# Patient Record
Sex: Male | Born: 1935 | Race: Asian | Hispanic: No | Marital: Married | Smoking: Current every day smoker
Health system: Southern US, Community
[De-identification: ages and names within clinical notes are randomized; demographics above are authoritative.]

## PROBLEM LIST (undated history)

## (undated) DIAGNOSIS — C61 Malignant neoplasm of prostate: Secondary | ICD-10-CM

## (undated) DIAGNOSIS — J45909 Unspecified asthma, uncomplicated: Secondary | ICD-10-CM

## (undated) DIAGNOSIS — M1990 Unspecified osteoarthritis, unspecified site: Secondary | ICD-10-CM

## (undated) DIAGNOSIS — K219 Gastro-esophageal reflux disease without esophagitis: Secondary | ICD-10-CM

## (undated) HISTORY — DX: Malignant neoplasm of prostate: C61

## (undated) HISTORY — DX: Unspecified osteoarthritis, unspecified site: M19.90

## (undated) HISTORY — PX: PROSTATE BIOPSY: SHX241

## (undated) HISTORY — DX: Gastro-esophageal reflux disease without esophagitis: K21.9

## (undated) HISTORY — DX: Unspecified asthma, uncomplicated: J45.909

---

## 2008-03-29 ENCOUNTER — Emergency Department (HOSPITAL_COMMUNITY): Admission: EM | Admit: 2008-03-29 | Discharge: 2008-03-29 | Payer: Self-pay | Admitting: Emergency Medicine

## 2008-07-13 ENCOUNTER — Encounter: Admission: RE | Admit: 2008-07-13 | Discharge: 2008-07-13 | Payer: Self-pay | Admitting: Pulmonary Disease

## 2011-04-15 ENCOUNTER — Encounter: Payer: Self-pay | Admitting: Gastroenterology

## 2013-06-27 ENCOUNTER — Other Ambulatory Visit (HOSPITAL_COMMUNITY): Payer: Self-pay | Admitting: Urology

## 2013-06-27 DIAGNOSIS — C61 Malignant neoplasm of prostate: Secondary | ICD-10-CM

## 2013-07-07 ENCOUNTER — Ambulatory Visit (HOSPITAL_COMMUNITY)
Admission: RE | Admit: 2013-07-07 | Discharge: 2013-07-07 | Disposition: A | Discharge status: Home / Self Care | Payer: Medicaid Other | Source: Ambulatory Visit | Attending: Urology | Admitting: Urology

## 2013-07-07 ENCOUNTER — Encounter (HOSPITAL_COMMUNITY): Payer: Medicaid Other

## 2013-07-07 DIAGNOSIS — C61 Malignant neoplasm of prostate: Secondary | ICD-10-CM | POA: Insufficient documentation

## 2013-07-07 MED ORDER — TECHNETIUM TC 99M MEDRONATE IV KIT
25.0000 | PACK | Freq: Once | INTRAVENOUS | Status: AC | PRN
  Administered 2013-07-07: 25 via INTRAVENOUS

## 2013-08-02 ENCOUNTER — Encounter: Payer: Self-pay | Admitting: Radiation Oncology

## 2013-08-02 ENCOUNTER — Ambulatory Visit
Admission: RE | Admit: 2013-08-02 | Discharge: 2013-08-02 | Disposition: A | Discharge status: Home / Self Care | Payer: Medicaid Other | Source: Ambulatory Visit | Attending: Radiation Oncology | Admitting: Radiation Oncology

## 2013-08-02 VITALS — Ht 60.0 in | Wt 118.2 lb

## 2013-08-02 DIAGNOSIS — F17200 Nicotine dependence, unspecified, uncomplicated: Secondary | ICD-10-CM | POA: Insufficient documentation

## 2013-08-02 DIAGNOSIS — K219 Gastro-esophageal reflux disease without esophagitis: Secondary | ICD-10-CM | POA: Diagnosis not present

## 2013-08-02 DIAGNOSIS — C61 Malignant neoplasm of prostate: Secondary | ICD-10-CM | POA: Diagnosis not present

## 2013-08-02 DIAGNOSIS — M1990 Unspecified osteoarthritis, unspecified site: Secondary | ICD-10-CM | POA: Insufficient documentation

## 2013-08-02 DIAGNOSIS — Z79899 Other long term (current) drug therapy: Secondary | ICD-10-CM | POA: Insufficient documentation

## 2013-08-02 DIAGNOSIS — J45909 Unspecified asthma, uncomplicated: Secondary | ICD-10-CM | POA: Diagnosis not present
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
## 2013-08-02 NOTE — Progress Notes (Signed)
GU Location of Tumor / Histology: prostatic adenocarcinoma (intermediate risk)  If Prostate Cancer, Gleason Score is (3 + 4) and PSA is (19.25)  Patient referred by Dr. Vista Lawman to Dr. Janice Norrie on 03/24/2012 for an elevated PSA of 15.5. PSA in March 2010 5.9.  Biopsies of prostate (if applicable) revealed:    Past/Anticipated interventions by urology, if any: Not a candidate for radical prostatectomy or seed implantation. Not ideal candidate for active surveillance. Nesi encouraging IMRT or cryoablation.   Past/Anticipated interventions by medical oncology, if any: None  Weight changes, if any: None noted  Bowel/Bladder complaints, if any: No dysuria. No hematuria. Nocturia x3.  Urgency.   Nausea/Vomiting, if any: None noted  Pain issues, if any:  None noted  SAFETY ISSUES:  Prior radiation? NO  Pacemaker/ICD? NO  Possible current pregnancy? NO  Is the patient on methotrexate? NO  Current Complaints / other details:  80 year old male. Married. Prostate volume 52.55 cc. Gabriel Newman had a negative prostate biopsy in Feb 2014 for PSA of 15.5. Current everyday smoker. NKDA. Niece often accompanies patient to interpret.

## 2013-08-02 NOTE — Progress Notes (Signed)
See progress note under physician encounter. 

## 2013-08-02 NOTE — Progress Notes (Signed)
Radiation Oncology         [phone] ________________________________  Initial outpatient Consultation  Name: Gabriel Newman  MRN: [id number]  Date: 08/02/2013  DOB: [birth year]  CC:No primary provider on file.  Arvil Persons, MD   REFERRING PHYSICIAN: Arvil Persons, MD  DIAGNOSIS: 80 y.o. gentleman with stage T1c adenocarcinoma of the prostate with a Gleason's score of 3+4 and a PSA of 19.25  HISTORY OF PRESENT ILLNESS::Gabriel Newman is a 80 y.o. gentleman.  He was noted to have an elevated PSA by his primary care physician, Dr. Vista Lawman of 15.5.  Accordingly, he was referred for evaluation in urology by Dr. Janice Norrie on 03/24/12.  Biopsy at that time was negative.  His PSA increased to 19.25 on 05/18/13,  digital rectal examination was performed on 05/25/13 by Dr. Janice Norrie revealed no nodules.  The patient proceeded to transrectal ultrasound with 12 biopsies of the prostate on 06/16/13.  The prostate volume measured 52.22 cc.  Out of 12 core biopsies, 3 were positive.  The maximum Gleason score was 3+4, and this was seen in the distribution below:   The patient reviewed the biopsy results with his urologist and he has kindly been referred today for discussion of potential radiation treatment options.  PREVIOUS RADIATION THERAPY: No  PAST MEDICAL HISTORY:  has a past medical history of Prostate cancer; Arthritis; Asthma; and GERD (gastroesophageal reflux disease).    PAST SURGICAL HISTORY: Past Surgical History  Procedure Laterality Date  . Prostate biopsy    . Prostate biopsy      FAMILY HISTORY: family history is negative for Cancer.  SOCIAL HISTORY:  reports that he has been smoking.  He has never used smokeless tobacco. He reports that he does not drink alcohol or use illicit drugs.  ALLERGIES: Review of patient's allergies indicates no known allergies.  MEDICATIONS:  Current Outpatient Prescriptions  Medication Sig Dispense Refill  . albuterol (PROVENTIL HFA;VENTOLIN HFA) 108 (90 BASE) MCG/ACT  inhaler Inhale into the lungs every 6 (six) hours as needed for wheezing or shortness of breath.      . ferrous sulfate 325 (65 FE) MG tablet Take 325 mg by mouth daily with breakfast.      . Fluticasone-Salmeterol (ADVAIR) 250-50 MCG/DOSE AEPB Inhale 1 puff into the lungs 2 (two) times daily.      Marland Kitchen glucosamine-chondroitin 500-400 MG tablet Take 1 tablet by mouth 3 (three) times daily.      . pravastatin (PRAVACHOL) 20 MG tablet Take 20 mg by mouth daily.       No current facility-administered medications for this encounter.    REVIEW OF SYSTEMS:  A 15 point review of systems is documented in the electronic medical record. This was obtained by the nursing staff. However, I reviewed this with the patient to discuss relevant findings and make appropriate changes.  A comprehensive review of systems was negative..  The patient completed an IPSS and IIEF questionnaire.  His IPSS score was 16 indicating moderate urinary outflow obstructive symptoms.  He indicated that his erectile function is not to complete sexual activity but this is not concerning to the patient.   PHYSICAL EXAM: This patient is in no acute distress.  He is alert and oriented.   height is 5' (1.524 m) and weight is 118 lb 3.2 oz (53.615 kg).  He exhibits no respiratory distress or labored breathing.  He appears neurologically intact.  His mood is pleasant.  His affect is appropriate.  Please note the digital rectal  exam findings described above.  KPS = 100  100 - Normal; no complaints; no evidence of disease. 90   - Able to carry on normal activity; minor signs or symptoms of disease. 80   - Normal activity with effort; some signs or symptoms of disease. 85   - Cares for self; unable to carry on normal activity or to do active work. 60   - Requires occasional assistance, but is able to care for most of his personal needs. 50   - Requires considerable assistance and frequent medical care. 32   - Disabled; requires special care and  assistance. 46   - Severely disabled; hospital admission is indicated although death not imminent. 61   - Very sick; hospital admission necessary; active supportive treatment necessary. 10   - Moribund; fatal processes progressing rapidly. 0     - Dead  Karnofsky DA, Abelmann WH, Craver LS and Burchenal JH [phone]) The use of the nitrogen mustards in the palliative treatment of carcinoma: with particular reference to bronchogenic carcinoma Cancer 1 634-56   LABORATORY DATA:  No results found for this basename: WBC, HGB, HCT, MCV, PLT   No results found for this basename: NA, K, CL, CO2   No results found for this basename: ALT, AST, GGT, ALKPHOS, BILITOT     RADIOGRAPHY: Nm Bone Scan Whole Body  07/07/2013   CLINICAL DATA:  Prostate cancer.  EXAM: NUCLEAR MEDICINE WHOLE BODY BONE SCAN  TECHNIQUE: Whole body anterior and posterior images were obtained approximately 3 hours after intravenous injection of radiopharmaceutical.  RADIOPHARMACEUTICALS:  25.0 mCi Technetium-99 MDP  COMPARISON:  None.  FINDINGS: There are mild areas of degenerative type uptake involving the shoulders, sternoclavicular joints, hips and knees. I do not see any definite findings for osseous metastatic disease.  IMPRESSION: Negative whole body bone scan for osseous metastatic disease.   Electronically Signed   By: Kalman Jewels M.D.   On: 07/07/2013 15:07      IMPRESSION: This gentleman is a 80 y.o. gentleman with stage T1c adenocarcinoma of the prostate with a Gleason's score of 3+4 and a PSA of 19.25.  His T-Stage, Gleason's Score, and PSA put him into the intermediate risk group.  Accordingly he is eligible for a variety of potential treatment options including external radiotherapy. This could be combined with androgen deprivation to improve the likelihood of disease control.  PLAN:Today I reviewed the findings and workup thus far.  We discussed the natural history of prostate cancer.  We reviewed the the implications  of T-stage, Gleason's Score, and PSA on decision-making and outcomes in prostate cancer.  We discussed radiation treatment in the management of prostate cancer with regard to the logistics and delivery of external beam radiation treatment as well as the logistics and delivery of prostate brachytherapy.  We compared and contrasted each of these approaches and also compared these against prostatectomy.  The patient expressed interest in external beam radiotherapy.  I filled out a patient counseling form for him with relevant treatment diagrams and we retained a copy for our records.   The patient would like to proceed with androgen deprivation and prostate IMRT.  I will share my findings with Dr. Janice Norrie and move forward with scheduling initiation of hormone therapy followed by placement of three gold fiducial markers into the prostate to proceed with IMRT 2 months after initiation of hormone therapy.     I enjoyed meeting with him today, and will look forward to participating in the care of  this very nice gentleman.   I spent 60 minutes face to face with the patient and more than 50% of that time was spent in counseling and/or coordination of care.   ------------------------------------------------  Sheral Apley. Tammi Klippel, M.D.

## 2013-08-03 ENCOUNTER — Telehealth: Payer: Self-pay | Admitting: *Deleted

## 2013-08-03 NOTE — Telephone Encounter (Signed)
Called patient to inform of appts., lvm for a return call 

## 2013-10-05 ENCOUNTER — Other Ambulatory Visit: Payer: Self-pay | Admitting: Radiation Oncology

## 2013-10-05 ENCOUNTER — Telehealth: Payer: Self-pay | Admitting: *Deleted

## 2013-10-05 NOTE — Telephone Encounter (Signed)
Called patient to inform that sim appt. Has been moved from 10-06-13 @ 10 am  To 10-13-13 @ 9am, spoke with patient and he seemed to understand, phoned the interperator line and spoke with them and they will call the patient and make sure that he does understand this.

## 2013-10-06 ENCOUNTER — Ambulatory Visit: Payer: Medicaid Other | Admitting: Radiation Oncology

## 2013-10-12 ENCOUNTER — Telehealth: Payer: Self-pay | Admitting: *Deleted

## 2013-10-12 NOTE — Telephone Encounter (Signed)
CALLED PATIENT TO REMIND OF SIM APPT. FOR 10-13-13 @ 9 AM , LVM FOR A RETURN CALL.

## 2013-10-13 ENCOUNTER — Ambulatory Visit
Admission: RE | Admit: 2013-10-13 | Discharge: 2013-10-13 | Disposition: A | Discharge status: Home / Self Care | Payer: Medicaid Other | Source: Ambulatory Visit | Attending: Radiation Oncology | Admitting: Radiation Oncology

## 2013-10-13 DIAGNOSIS — C61 Malignant neoplasm of prostate: Secondary | ICD-10-CM

## 2013-10-13 DIAGNOSIS — Z510 Encounter for antineoplastic radiation therapy: Secondary | ICD-10-CM | POA: Diagnosis not present

## 2013-10-13 NOTE — Progress Notes (Signed)
  Radiation Oncology         [phone] ________________________________  Name: Marchello Rothgeb MRN: [id number]  Date: 10/13/2013  DOB: 1932/09/25  SIMULATION AND TREATMENT PLANNING NOTE  DIAGNOSIS:  Stage T1c adenocarcinoma of the prostate with a Gleason's Score of 3+4 and a PSA of 19.25 - Intermediate Risk  NARRATIVE:  The patient was brought to the [place] suite.  Identity was confirmed.  All relevant records and images related to the planned course of therapy were reviewed.  The patient freely provided informed written consent to proceed with treatment after reviewing the details related to the planned course of therapy. The consent form was witnessed and verified by the simulation staff.  Then, the patient was set-up in a stable reproducible supine position for radiation therapy.  A vacuum lock pillow device was custom fabricated to position his legs in a reproducible immobilized position.  Then, I performed a urethrogram under sterile conditions to identify the prostatic apex.  CT images were obtained.  Surface markings were placed.  The CT images were loaded into the planning software.  Then the prostate target and avoidance structures including the rectum, bladder, bowel and hips were contoured.  Treatment planning then occurred.  The radiation prescription was entered and confirmed.  A total of 1 complex treatment device was fabricated. I have requested : Intensity Modulated Radiotherapy (IMRT) is medically necessary for this case for the following reason:  Rectal sparing.  PLAN:  The patient will receive 78 Gy in 40 fractions.  ________________________________  Sheral Apley Tammi Klippel, M.D.

## 2013-10-16 ENCOUNTER — Telehealth: Payer: Self-pay | Admitting: Radiation Oncology

## 2013-10-16 NOTE — Telephone Encounter (Signed)
Place orange folder with [facility] paperwork in Dr. Johny Shears inbox to sign. Patient doesn't begin treatment until 10/24/2013.

## 2013-10-22 DIAGNOSIS — Z510 Encounter for antineoplastic radiation therapy: Secondary | ICD-10-CM | POA: Diagnosis not present

## 2013-10-24 ENCOUNTER — Ambulatory Visit
Admission: RE | Admit: 2013-10-24 | Discharge: 2013-10-24 | Disposition: A | Discharge status: Home / Self Care | Payer: Medicaid Other | Source: Ambulatory Visit | Attending: Radiation Oncology | Admitting: Radiation Oncology

## 2013-10-24 DIAGNOSIS — Z510 Encounter for antineoplastic radiation therapy: Secondary | ICD-10-CM | POA: Diagnosis not present

## 2013-10-25 ENCOUNTER — Ambulatory Visit
Admission: RE | Admit: 2013-10-25 | Discharge: 2013-10-25 | Disposition: A | Discharge status: Home / Self Care | Payer: Medicaid Other | Source: Ambulatory Visit | Attending: Radiation Oncology | Admitting: Radiation Oncology

## 2013-10-25 DIAGNOSIS — Z510 Encounter for antineoplastic radiation therapy: Secondary | ICD-10-CM | POA: Diagnosis not present

## 2013-10-26 ENCOUNTER — Ambulatory Visit
Admission: RE | Admit: 2013-10-26 | Discharge: 2013-10-26 | Disposition: A | Discharge status: Home / Self Care | Payer: Medicaid Other | Source: Ambulatory Visit | Attending: Radiation Oncology | Admitting: Radiation Oncology

## 2013-10-26 DIAGNOSIS — Z510 Encounter for antineoplastic radiation therapy: Secondary | ICD-10-CM | POA: Diagnosis not present

## 2013-10-27 ENCOUNTER — Ambulatory Visit
Admission: RE | Admit: 2013-10-27 | Discharge: 2013-10-27 | Disposition: A | Discharge status: Home / Self Care | Payer: Medicaid Other | Source: Ambulatory Visit | Attending: Radiation Oncology | Admitting: Radiation Oncology

## 2013-10-27 ENCOUNTER — Encounter: Payer: Self-pay | Admitting: Radiation Oncology

## 2013-10-27 VITALS — BP 126/70 | HR 75 | Resp 16 | Wt 114.8 lb

## 2013-10-27 DIAGNOSIS — C61 Malignant neoplasm of prostate: Secondary | ICD-10-CM

## 2013-10-27 DIAGNOSIS — Z510 Encounter for antineoplastic radiation therapy: Secondary | ICD-10-CM | POA: Diagnosis not present

## 2013-10-27 NOTE — Progress Notes (Addendum)
Denies dysuria or hematuria. Weight and vitals stable. Denies pain. Reports nocturia x4. Remain active and denies fatigue. Denies urgency, incontinence or difficulty emptying his bladder. Accompanied by translator, Elberta Fortis today. Post sim education completed today. Oriented patient to staff and routine of the clinic. Provided patient with RADIATION THERAPY AND YOU handbook then, reviewed pertinent information. Educated patient reference potential side effects and management such as, fatigue, skin changes, and urinary/bladder changes. Allow patient the opportunity to ask questions. Patient verbalized understanding of all reviewed.

## 2013-10-27 NOTE — Progress Notes (Signed)
  Radiation Oncology         [phone] ________________________________  Name: Gabriel Newman MRN: [id number]  Date: 10/27/2013  DOB: 1932/07/27  Weekly Radiation Therapy Management  Current Dose: 7.8 Gy     Planned Dose:  78 Gy  Narrative . . . . . . . . The patient presents for routine under treatment assessment.                                   The patient is without complaint.                                 Set-up films were reviewed.                                 The chart was checked. Physical Findings. . .  weight is 114 lb 12.8 oz (52.073 kg). His blood pressure is 126/70 and his pulse is 75. His respiration is 16. . Weight essentially stable.  No significant changes. Impression . . . . . . . The patient is tolerating radiation. Plan . . . . . . . . . . . . Continue treatment as planned.  ________________________________  Sheral Apley. Tammi Klippel, M.D.

## 2013-10-30 ENCOUNTER — Ambulatory Visit
Admission: RE | Admit: 2013-10-30 | Discharge: 2013-10-30 | Disposition: A | Discharge status: Home / Self Care | Payer: Medicaid Other | Source: Ambulatory Visit | Attending: Radiation Oncology | Admitting: Radiation Oncology

## 2013-10-30 DIAGNOSIS — Z510 Encounter for antineoplastic radiation therapy: Secondary | ICD-10-CM | POA: Diagnosis not present

## 2013-10-31 ENCOUNTER — Ambulatory Visit
Admission: RE | Admit: 2013-10-31 | Discharge: 2013-10-31 | Disposition: A | Discharge status: Home / Self Care | Payer: Medicaid Other | Source: Ambulatory Visit | Attending: Radiation Oncology | Admitting: Radiation Oncology

## 2013-10-31 DIAGNOSIS — Z510 Encounter for antineoplastic radiation therapy: Secondary | ICD-10-CM | POA: Diagnosis not present

## 2013-11-01 ENCOUNTER — Ambulatory Visit
Admission: RE | Admit: 2013-11-01 | Discharge: 2013-11-01 | Disposition: A | Discharge status: Home / Self Care | Payer: Medicaid Other | Source: Ambulatory Visit | Attending: Radiation Oncology | Admitting: Radiation Oncology

## 2013-11-01 DIAGNOSIS — Z510 Encounter for antineoplastic radiation therapy: Secondary | ICD-10-CM | POA: Diagnosis not present

## 2013-11-02 ENCOUNTER — Encounter: Payer: Self-pay | Admitting: *Deleted

## 2013-11-02 ENCOUNTER — Ambulatory Visit
Admission: RE | Admit: 2013-11-02 | Discharge: 2013-11-02 | Disposition: A | Discharge status: Home / Self Care | Payer: Medicaid Other | Source: Ambulatory Visit | Attending: Radiation Oncology | Admitting: Radiation Oncology

## 2013-11-02 DIAGNOSIS — Z510 Encounter for antineoplastic radiation therapy: Secondary | ICD-10-CM | POA: Diagnosis not present

## 2013-11-02 NOTE — Progress Notes (Signed)
[place] Work  Clinical Social Work completed Product/process development scientist on behalf of pt. [facility] has approved pt for transportation assistance and can begin providing services immediately. [facility] reports they contacted pt's contact that speaks English and relayed this information as well.  Clinical Social Worker available to assist further as needed.    Clinical Social Work interventions: Resource assistance  [employer] Clinical Social Worker [place] [place] [place] for [place] Wednesday, Thursday and Friday Phone: [phone] Fax: [phone]

## 2013-11-03 ENCOUNTER — Ambulatory Visit
Admission: RE | Admit: 2013-11-03 | Discharge: 2013-11-03 | Disposition: A | Discharge status: Home / Self Care | Payer: Medicaid Other | Source: Ambulatory Visit | Attending: Radiation Oncology | Admitting: Radiation Oncology

## 2013-11-03 ENCOUNTER — Encounter: Payer: Self-pay | Admitting: Radiation Oncology

## 2013-11-03 VITALS — BP 151/72 | HR 72 | Resp 16 | Wt 115.9 lb

## 2013-11-03 DIAGNOSIS — C61 Malignant neoplasm of prostate: Secondary | ICD-10-CM

## 2013-11-03 DIAGNOSIS — Z510 Encounter for antineoplastic radiation therapy: Secondary | ICD-10-CM | POA: Diagnosis not present

## 2013-11-03 NOTE — Progress Notes (Signed)
  Radiation Oncology         [phone] ________________________________  Name: Gabriel Newman MRN: [id number]  Date: 11/03/2013 DOB: 1933-01-08  Weekly Radiation Therapy Management  Current Dose: 17.55 Gy     Planned Dose:  78 Gy  Narrative . . . . . . . . The patient presents for routine under treatment assessment.                                   The patient is without complaint.                                 Set-up films were reviewed.                                 The chart was checked. Physical Findings. . .  weight is 115 lb 14.4 oz (52.572 kg). His blood pressure is 151/72 and his pulse is 72. His respiration is 16. . Weight essentially stable.  No significant changes. Impression . . . . . . . The patient is tolerating radiation. Plan . . . . . . . . . . . . Continue treatment as planned.  ________________________________  Sheral Apley. Tammi Klippel, M.D.

## 2013-11-03 NOTE — Progress Notes (Signed)
Denies pain today. Weight and vitals stable. Denies hematuria, dysuria or diarrhea. Reports nocturia x4. Denies fatigue.

## 2013-11-06 ENCOUNTER — Ambulatory Visit
Admission: RE | Admit: 2013-11-06 | Discharge: 2013-11-06 | Disposition: A | Discharge status: Home / Self Care | Payer: Medicaid Other | Source: Ambulatory Visit | Attending: Radiation Oncology | Admitting: Radiation Oncology

## 2013-11-06 DIAGNOSIS — Z510 Encounter for antineoplastic radiation therapy: Secondary | ICD-10-CM | POA: Diagnosis not present

## 2013-11-07 ENCOUNTER — Ambulatory Visit
Admission: RE | Admit: 2013-11-07 | Discharge: 2013-11-07 | Disposition: A | Discharge status: Home / Self Care | Payer: Medicaid Other | Source: Ambulatory Visit | Attending: Radiation Oncology | Admitting: Radiation Oncology

## 2013-11-07 DIAGNOSIS — Z510 Encounter for antineoplastic radiation therapy: Secondary | ICD-10-CM | POA: Diagnosis not present

## 2013-11-07 NOTE — Addendum Note (Signed)
Encounter addended by: Heywood Footman, RN on: 11/07/2013 11:43 AM<BR>     Documentation filed: Notes Section, Inpatient Document Flowsheet

## 2013-11-08 ENCOUNTER — Ambulatory Visit
Admission: RE | Admit: 2013-11-08 | Discharge: 2013-11-08 | Disposition: A | Discharge status: Home / Self Care | Payer: Medicaid Other | Source: Ambulatory Visit | Attending: Radiation Oncology | Admitting: Radiation Oncology

## 2013-11-08 DIAGNOSIS — Z510 Encounter for antineoplastic radiation therapy: Secondary | ICD-10-CM | POA: Diagnosis not present

## 2013-11-09 ENCOUNTER — Ambulatory Visit
Admission: RE | Admit: 2013-11-09 | Discharge: 2013-11-09 | Disposition: A | Discharge status: Home / Self Care | Payer: Medicaid Other | Source: Ambulatory Visit | Attending: Radiation Oncology | Admitting: Radiation Oncology

## 2013-11-09 DIAGNOSIS — Z510 Encounter for antineoplastic radiation therapy: Secondary | ICD-10-CM | POA: Diagnosis not present

## 2013-11-10 ENCOUNTER — Ambulatory Visit
Admission: RE | Admit: 2013-11-10 | Discharge: 2013-11-10 | Disposition: A | Discharge status: Home / Self Care | Payer: Medicaid Other | Source: Ambulatory Visit | Attending: Radiation Oncology | Admitting: Radiation Oncology

## 2013-11-10 ENCOUNTER — Encounter: Payer: Self-pay | Admitting: Radiation Oncology

## 2013-11-10 VITALS — BP 117/68 | HR 70 | Resp 16 | Wt 116.1 lb

## 2013-11-10 DIAGNOSIS — Z510 Encounter for antineoplastic radiation therapy: Secondary | ICD-10-CM | POA: Diagnosis not present

## 2013-11-10 DIAGNOSIS — C61 Malignant neoplasm of prostate: Secondary | ICD-10-CM
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
## 2013-11-14 ENCOUNTER — Ambulatory Visit
Admission: RE | Admit: 2013-11-14 | Discharge: 2013-11-14 | Disposition: A | Discharge status: Home / Self Care | Payer: Medicaid Other | Source: Ambulatory Visit | Attending: Radiation Oncology | Admitting: Radiation Oncology

## 2013-11-14 DIAGNOSIS — Z510 Encounter for antineoplastic radiation therapy: Secondary | ICD-10-CM | POA: Diagnosis not present

## 2013-11-15 ENCOUNTER — Ambulatory Visit
Admission: RE | Admit: 2013-11-15 | Discharge: 2013-11-15 | Disposition: A | Discharge status: Home / Self Care | Payer: Medicaid Other | Source: Ambulatory Visit | Attending: Radiation Oncology | Admitting: Radiation Oncology

## 2013-11-15 DIAGNOSIS — Z510 Encounter for antineoplastic radiation therapy: Secondary | ICD-10-CM | POA: Diagnosis not present

## 2013-11-16 ENCOUNTER — Ambulatory Visit
Admission: RE | Admit: 2013-11-16 | Discharge: 2013-11-16 | Disposition: A | Discharge status: Home / Self Care | Payer: Medicaid Other | Source: Ambulatory Visit | Attending: Radiation Oncology | Admitting: Radiation Oncology

## 2013-11-16 ENCOUNTER — Ambulatory Visit: Payer: Medicaid Other | Admitting: Radiation Oncology

## 2013-11-16 DIAGNOSIS — Z510 Encounter for antineoplastic radiation therapy: Secondary | ICD-10-CM | POA: Diagnosis not present

## 2013-11-17 ENCOUNTER — Ambulatory Visit
Admission: RE | Admit: 2013-11-17 | Discharge: 2013-11-17 | Disposition: A | Discharge status: Home / Self Care | Payer: Medicaid Other | Source: Ambulatory Visit | Attending: Radiation Oncology | Admitting: Radiation Oncology

## 2013-11-17 ENCOUNTER — Encounter: Payer: Self-pay | Admitting: Radiation Oncology

## 2013-11-17 VITALS — BP 125/64 | HR 66 | Resp 16 | Wt 117.3 lb

## 2013-11-17 DIAGNOSIS — C61 Malignant neoplasm of prostate: Secondary | ICD-10-CM

## 2013-11-17 DIAGNOSIS — Z510 Encounter for antineoplastic radiation therapy: Secondary | ICD-10-CM | POA: Diagnosis not present

## 2013-11-17 NOTE — Progress Notes (Signed)
Weight and vitals stable. Denies pain or fatigue. Reports nocturia x5. Denies hematuria or dysuria. Denies diarrhea. Concerned he isn't emptying his bladder completely. Reports shortly after he has the urge to void again but, nothing come out. Denies incontinence or leakage.

## 2013-11-17 NOTE — Progress Notes (Signed)
  Radiation Oncology         [phone] ________________________________  Name: Gabriel Newman MRN: [id number]  Date: 11/17/2013  DOB: 09-22-1932  Weekly Radiation Therapy Management  Current Dose: 35.1 Gy     Planned Dose:  78 Gy  Narrative . . . . . . . . The patient presents for routine under treatment assessment.                                  Weight and vitals stable. Denies pain or fatigue. Reports nocturia x5. Denies hematuria or dysuria. Denies diarrhea. Concerned he isn't emptying his bladder completely. Reports shortly after he has the urge to void again but, nothing come out. Denies incontinence or leakage                                 Set-up films were reviewed.                                 The chart was checked. Physical Findings. . .  weight is 117 lb 4.8 oz (53.207 kg). His blood pressure is 125/64 and his pulse is 66. His respiration is 16. . Weight essentially stable.  No significant changes. Impression . . . . . . . The patient is tolerating radiation. Plan . . . . . . . . . . . . Continue treatment as planned.  ________________________________  Sheral Apley. Tammi Klippel, M.D.

## 2013-11-20 ENCOUNTER — Ambulatory Visit
Admission: RE | Admit: 2013-11-20 | Discharge: 2013-11-20 | Disposition: A | Discharge status: Home / Self Care | Payer: Medicaid Other | Source: Ambulatory Visit | Attending: Radiation Oncology | Admitting: Radiation Oncology

## 2013-11-20 DIAGNOSIS — Z510 Encounter for antineoplastic radiation therapy: Secondary | ICD-10-CM | POA: Diagnosis not present

## 2013-11-21 ENCOUNTER — Ambulatory Visit
Admission: RE | Admit: 2013-11-21 | Discharge: 2013-11-21 | Disposition: A | Discharge status: Home / Self Care | Payer: Medicaid Other | Source: Ambulatory Visit | Attending: Radiation Oncology | Admitting: Radiation Oncology

## 2013-11-21 DIAGNOSIS — Z510 Encounter for antineoplastic radiation therapy: Secondary | ICD-10-CM | POA: Diagnosis not present

## 2013-11-22 ENCOUNTER — Ambulatory Visit
Admission: RE | Admit: 2013-11-22 | Discharge: 2013-11-22 | Disposition: A | Discharge status: Home / Self Care | Payer: Medicaid Other | Source: Ambulatory Visit | Attending: Radiation Oncology | Admitting: Radiation Oncology

## 2013-11-22 DIAGNOSIS — Z510 Encounter for antineoplastic radiation therapy: Secondary | ICD-10-CM | POA: Diagnosis not present

## 2013-11-23 ENCOUNTER — Encounter: Payer: Self-pay | Admitting: Radiation Oncology

## 2013-11-23 ENCOUNTER — Ambulatory Visit
Admission: RE | Admit: 2013-11-23 | Discharge: 2013-11-23 | Disposition: A | Discharge status: Home / Self Care | Payer: Medicaid Other | Source: Ambulatory Visit | Attending: Radiation Oncology | Admitting: Radiation Oncology

## 2013-11-23 VITALS — BP 120/55 | HR 70 | Temp 98.0°F | Resp 20 | Wt 119.3 lb

## 2013-11-23 DIAGNOSIS — C61 Malignant neoplasm of prostate: Secondary | ICD-10-CM

## 2013-11-23 DIAGNOSIS — Z510 Encounter for antineoplastic radiation therapy: Secondary | ICD-10-CM | POA: Diagnosis not present

## 2013-11-23 MED ORDER — TAMSULOSIN HCL 0.4 MG PO CAPS: 0.4000 mg | ORAL_CAPSULE | Freq: Every day | ORAL | Status: DC

## 2013-11-23 NOTE — Progress Notes (Signed)
  Radiation Oncology         [phone] ________________________________  Name: Gabriel Newman MRN: [id number]  Date: 11/23/2013 DOB: 09-Aug-1932    Weekly Radiation Therapy Management  Current Dose: 42.9 Gy     Planned Dose:  78 Gy  Narrative . . . . . . . . The patient presents for routine under treatment assessment.                                  Interpreter present with patient today. Patient states he may want medication for urinary frequency. He states he had nocturia last night x 5-6. He denies urgency, dysuria but reports he feels he has emptied his bladder but then "a few more drops come out". He denies straining to urinate. He denies diarrhea, loss of appetite, pain . Pt took Tylenol yesterday due to "feeling chills" and states that caused his BM to be hard. Patient is fatigued                                 Set-up films were reviewed.                                 The chart was checked. Physical Findings. . .  weight is 119 lb 4.8 oz (54.114 kg). His oral temperature is 98 F (36.7 C). His blood pressure is 120/55 and his pulse is 70. His respiration is 20. . Weight essentially stable.  No significant changes. Impression . . . . . . . The patient is tolerating radiation. Plan . . . . . . . . . . . . Continue treatment as planned.  Started Flomax  ________________________________  Sheral Apley Tammi Klippel, M.D.

## 2013-11-23 NOTE — Progress Notes (Signed)
Interpreter present with patient today. Patient states he may want medication for urinary frequency. He states he had nocturia last night x 5-6. He denies urgency, dysuria but reports he feels he has emptied his bladder but then "a few more drops come out". He denies straining to urinate. He denies diarrhea, loss of appetite, pain . Pt took Tylenol yesterday due to "feeling chills" and states that caused his BM to be hard. Patient is fatigued.

## 2013-11-24 ENCOUNTER — Ambulatory Visit
Admission: RE | Admit: 2013-11-24 | Discharge: 2013-11-24 | Disposition: A | Discharge status: Home / Self Care | Payer: Medicaid Other | Source: Ambulatory Visit | Attending: Radiation Oncology | Admitting: Radiation Oncology

## 2013-11-24 DIAGNOSIS — Z510 Encounter for antineoplastic radiation therapy: Secondary | ICD-10-CM | POA: Diagnosis not present

## 2013-11-27 ENCOUNTER — Ambulatory Visit
Admission: RE | Admit: 2013-11-27 | Discharge: 2013-11-27 | Disposition: A | Discharge status: Home / Self Care | Payer: Medicaid Other | Source: Ambulatory Visit | Attending: Radiation Oncology | Admitting: Radiation Oncology

## 2013-11-27 DIAGNOSIS — Z510 Encounter for antineoplastic radiation therapy: Secondary | ICD-10-CM | POA: Diagnosis not present

## 2013-11-28 ENCOUNTER — Ambulatory Visit
Admission: RE | Admit: 2013-11-28 | Discharge: 2013-11-28 | Disposition: A | Discharge status: Home / Self Care | Payer: Medicaid Other | Source: Ambulatory Visit | Attending: Radiation Oncology | Admitting: Radiation Oncology

## 2013-11-28 DIAGNOSIS — Z510 Encounter for antineoplastic radiation therapy: Secondary | ICD-10-CM | POA: Diagnosis not present

## 2013-11-29 ENCOUNTER — Ambulatory Visit
Admission: RE | Admit: 2013-11-29 | Discharge: 2013-11-29 | Disposition: A | Discharge status: Home / Self Care | Payer: Medicaid Other | Source: Ambulatory Visit | Attending: Radiation Oncology | Admitting: Radiation Oncology

## 2013-11-29 DIAGNOSIS — Z510 Encounter for antineoplastic radiation therapy: Secondary | ICD-10-CM | POA: Diagnosis not present

## 2013-11-30 ENCOUNTER — Ambulatory Visit
Admission: RE | Admit: 2013-11-30 | Discharge: 2013-11-30 | Disposition: A | Discharge status: Home / Self Care | Payer: Medicaid Other | Source: Ambulatory Visit | Attending: Radiation Oncology | Admitting: Radiation Oncology

## 2013-11-30 ENCOUNTER — Encounter: Payer: Self-pay | Admitting: Radiation Oncology

## 2013-11-30 VITALS — BP 151/65 | HR 76 | Resp 16 | Wt 119.8 lb

## 2013-11-30 DIAGNOSIS — C61 Malignant neoplasm of prostate: Secondary | ICD-10-CM

## 2013-11-30 DIAGNOSIS — Z510 Encounter for antineoplastic radiation therapy: Secondary | ICD-10-CM | POA: Diagnosis not present

## 2013-11-30 MED ORDER — HYDROCOD POLST-CHLORPHEN POLST 10-8 MG/5ML PO LQCR: 5.0000 mL | Freq: Two times a day (BID) | ORAL | Status: DC | PRN

## 2013-11-30 NOTE — Progress Notes (Signed)
  Radiation Oncology         [phone] ________________________________  Name: Gabriel Newman MRN: [id number]  Date: 11/30/2013  DOB: [birth year]   Weekly Radiation Therapy Management    ICD-9-CM ICD-10-CM  1. Stage T1c adenocarcinoma of the prostate with a Gleason's Score of 3+4 and a PSA of 19.25 - Intermediate Risk 185 C61    Current Dose: 52.65 Gy     Planned Dose:  78 Gy  Narrative . . . . . . . . The patient presents for routine under treatment assessment.                                   Denies dysuria or hematuria. Denies diarrhea. Reports nocturia x4. Denies headache, dizziness, nausea or vomiting. Weight and vitals stable. Reports fatigue. Reports a productive cough with white mucous x 10 days. Reports he plans to travel to [place] for a month following treatment                                 Set-up films were reviewed.                                 The chart was checked. Physical Findings. . .  weight is 119 lb 12.8 oz (54.341 kg). His blood pressure is 151/65 and his pulse is 76. His respiration is 16. . Weight essentially stable.  No significant changes. Impression . . . . . . . The patient is tolerating radiation. Plan . . . . . . . . . . . . Continue treatment as planned.  Given Tussionex PK  ________________________________  Sheral Apley. Tammi Klippel, M.D.

## 2013-11-30 NOTE — Progress Notes (Signed)
Denies dysuria or hematuria. Denies diarrhea. Reports nocturia x4. Denies headache, dizziness, nausea or vomiting. Weight and vitals stable. Reports fatigue. Reports a productive cough with white mucous x 10 days. Reports he plans to travel to [place] for a month following treatment.

## 2013-12-01 ENCOUNTER — Ambulatory Visit
Admission: RE | Admit: 2013-12-01 | Discharge: 2013-12-01 | Disposition: A | Discharge status: Home / Self Care | Payer: Medicaid Other | Source: Ambulatory Visit | Attending: Radiation Oncology | Admitting: Radiation Oncology

## 2013-12-01 DIAGNOSIS — Z510 Encounter for antineoplastic radiation therapy: Secondary | ICD-10-CM | POA: Diagnosis not present

## 2013-12-04 ENCOUNTER — Ambulatory Visit
Admission: RE | Admit: 2013-12-04 | Discharge: 2013-12-04 | Disposition: A | Discharge status: Home / Self Care | Payer: Medicaid Other | Source: Ambulatory Visit | Attending: Radiation Oncology | Admitting: Radiation Oncology

## 2013-12-04 DIAGNOSIS — Z510 Encounter for antineoplastic radiation therapy: Secondary | ICD-10-CM | POA: Diagnosis not present

## 2013-12-05 ENCOUNTER — Ambulatory Visit
Admission: RE | Admit: 2013-12-05 | Discharge: 2013-12-05 | Disposition: A | Discharge status: Home / Self Care | Payer: Medicaid Other | Source: Ambulatory Visit | Attending: Radiation Oncology | Admitting: Radiation Oncology

## 2013-12-05 DIAGNOSIS — Z510 Encounter for antineoplastic radiation therapy: Secondary | ICD-10-CM | POA: Diagnosis not present

## 2013-12-06 ENCOUNTER — Ambulatory Visit
Admission: RE | Admit: 2013-12-06 | Discharge: 2013-12-06 | Disposition: A | Discharge status: Home / Self Care | Payer: Medicaid Other | Source: Ambulatory Visit | Attending: Radiation Oncology | Admitting: Radiation Oncology

## 2013-12-06 DIAGNOSIS — Z510 Encounter for antineoplastic radiation therapy: Secondary | ICD-10-CM | POA: Diagnosis not present

## 2013-12-07 ENCOUNTER — Ambulatory Visit
Admission: RE | Admit: 2013-12-07 | Discharge: 2013-12-07 | Disposition: A | Discharge status: Home / Self Care | Payer: Medicaid Other | Source: Ambulatory Visit | Attending: Radiation Oncology | Admitting: Radiation Oncology

## 2013-12-07 DIAGNOSIS — R351 Nocturia: Secondary | ICD-10-CM | POA: Insufficient documentation

## 2013-12-07 DIAGNOSIS — C61 Malignant neoplasm of prostate: Secondary | ICD-10-CM | POA: Diagnosis not present

## 2013-12-07 DIAGNOSIS — Z510 Encounter for antineoplastic radiation therapy: Secondary | ICD-10-CM | POA: Diagnosis not present

## 2013-12-08 ENCOUNTER — Ambulatory Visit
Admission: RE | Admit: 2013-12-08 | Discharge: 2013-12-08 | Disposition: A | Discharge status: Home / Self Care | Payer: Medicaid Other | Source: Ambulatory Visit | Attending: Radiation Oncology | Admitting: Radiation Oncology

## 2013-12-08 ENCOUNTER — Encounter: Payer: Self-pay | Admitting: Radiation Oncology

## 2013-12-08 VITALS — BP 128/67 | HR 76 | Temp 97.8°F | Resp 20 | Wt 118.6 lb

## 2013-12-08 DIAGNOSIS — Z510 Encounter for antineoplastic radiation therapy: Secondary | ICD-10-CM | POA: Diagnosis not present

## 2013-12-08 DIAGNOSIS — C61 Malignant neoplasm of prostate: Secondary | ICD-10-CM

## 2013-12-08 NOTE — Progress Notes (Signed)
Interpreter present today. Patient denies pain, loss of appetite, fatigue. He does states his appetite is "okay". Patient states he continues to have nocturia x 3-4, denies daytime frequency, denies dysuria, states he does dribble at end of voiding. He states he has not had BM x 2-3 days, advised he begin Colace 2 tabs nightly. He states his cold is improved.

## 2013-12-08 NOTE — Progress Notes (Signed)
  Radiation Oncology         [phone] ________________________________  Name: Gabriel Newman MRN: [id number]  Date: 12/08/2013  DOB: [birth year]   Weekly Radiation Therapy Management    ICD-9-CM ICD-10-CM  1. Stage T1c adenocarcinoma of the prostate with a Gleason's Score of 3+4 and a PSA of 19.25 - Intermediate Risk 185 C61    Current Dose: 64.35 Gy     Planned Dose:  78 Gy  Narrative . . . . . . . . The patient presents for routine under treatment assessment.                                   Interpreter present today. Patient denies pain, loss of appetite, fatigue. He does states his appetite is "okay". Patient states he continues to have nocturia x 3-4, denies daytime frequency, denies dysuria, states he does dribble at end of voiding. He states he has not had BM x 2-3 days, advised he begin Colace 2 tabs nightly. He states his cold is improved                                 Set-up films were reviewed.                                 The chart was checked. Physical Findings. . .  weight is 118 lb 9.6 oz (53.797 kg). His oral temperature is 97.8 F (36.6 C). His blood pressure is 128/67 and his pulse is 76. His respiration is 20. . Weight essentially stable.  No significant changes. Impression . . . . . . . The patient is tolerating radiation. Plan . . . . . . . . . . . . Continue treatment as planned.  ________________________________  Sheral Apley. Tammi Klippel, M.D.

## 2013-12-11 ENCOUNTER — Ambulatory Visit
Admission: RE | Admit: 2013-12-11 | Discharge: 2013-12-11 | Disposition: A | Discharge status: Home / Self Care | Payer: Medicaid Other | Source: Ambulatory Visit | Attending: Radiation Oncology | Admitting: Radiation Oncology

## 2013-12-11 DIAGNOSIS — Z510 Encounter for antineoplastic radiation therapy: Secondary | ICD-10-CM | POA: Diagnosis not present

## 2013-12-12 ENCOUNTER — Ambulatory Visit
Admission: RE | Admit: 2013-12-12 | Discharge: 2013-12-12 | Disposition: A | Discharge status: Home / Self Care | Payer: Medicaid Other | Source: Ambulatory Visit | Attending: Radiation Oncology | Admitting: Radiation Oncology

## 2013-12-12 DIAGNOSIS — Z510 Encounter for antineoplastic radiation therapy: Secondary | ICD-10-CM | POA: Diagnosis not present

## 2013-12-13 ENCOUNTER — Ambulatory Visit
Admission: RE | Admit: 2013-12-13 | Discharge: 2013-12-13 | Disposition: A | Discharge status: Home / Self Care | Payer: Medicaid Other | Source: Ambulatory Visit | Attending: Radiation Oncology | Admitting: Radiation Oncology

## 2013-12-13 ENCOUNTER — Encounter: Payer: Self-pay | Admitting: Radiation Oncology

## 2013-12-13 VITALS — BP 125/65 | HR 79 | Resp 16 | Wt 119.1 lb

## 2013-12-13 DIAGNOSIS — C61 Malignant neoplasm of prostate: Secondary | ICD-10-CM

## 2013-12-13 DIAGNOSIS — Z510 Encounter for antineoplastic radiation therapy: Secondary | ICD-10-CM | POA: Diagnosis not present

## 2013-12-13 NOTE — Progress Notes (Signed)
Reports day time frequency. Reports nocturia x5. Reports a normal strong steady urine stream. Denies pain or fatigue. Weight and vitals stable. Reports nocturia x 5. Denies dysuria. Reports taking two colace tablets each night as directed by Dr. Tammi Klippel. Reports soft stools x2 yesterday but, denies diarrhea.

## 2013-12-13 NOTE — Progress Notes (Signed)
  Radiation Oncology         [phone] ________________________________  Name: Gabriel Newman MRN: [id number]  Date: 12/13/2013  DOB: Jul 05, 1932   Weekly Radiation Therapy Management    ICD-9-CM ICD-10-CM  1. Stage T1c adenocarcinoma of the prostate with a Gleason's Score of 3+4 and a PSA of 19.25 - Intermediate Risk 185 C61    Current Dose: 70.2 Gy     Planned Dose:  78 Gy  Narrative . . . . . . . . The patient presents for routine under treatment assessment.                                   Reports day time frequency. Reports nocturia x5. Reports a normal strong steady urine stream. Denies pain or fatigue. Weight and vitals stable. Reports nocturia x 5. Denies dysuria. Reports taking two colace tablets each night as directed by Dr. Tammi Klippel. Reports soft stools x2 yesterday but, denies diarrhea.                                 Set-up films were reviewed.                                 The chart was checked. Physical Findings. . .  weight is 119 lb 1.6 oz (54.023 kg). His blood pressure is 125/65 and his pulse is 79. His respiration is 16. . Weight essentially stable.  No significant changes. Impression . . . . . . . The patient is tolerating radiation. Plan . . . . . . . . . . . . Continue treatment as planned.  ________________________________  Sheral Apley. Tammi Klippel, M.D.

## 2013-12-14 ENCOUNTER — Ambulatory Visit
Admission: RE | Admit: 2013-12-14 | Discharge: 2013-12-14 | Disposition: A | Discharge status: Home / Self Care | Payer: Medicaid Other | Source: Ambulatory Visit | Attending: Radiation Oncology | Admitting: Radiation Oncology

## 2013-12-14 DIAGNOSIS — Z510 Encounter for antineoplastic radiation therapy: Secondary | ICD-10-CM | POA: Diagnosis not present

## 2013-12-15 ENCOUNTER — Ambulatory Visit
Admission: RE | Admit: 2013-12-15 | Discharge: 2013-12-15 | Disposition: A | Discharge status: Home / Self Care | Payer: Medicaid Other | Source: Ambulatory Visit | Attending: Radiation Oncology | Admitting: Radiation Oncology

## 2013-12-15 DIAGNOSIS — Z510 Encounter for antineoplastic radiation therapy: Secondary | ICD-10-CM | POA: Diagnosis not present

## 2013-12-18 ENCOUNTER — Ambulatory Visit
Admission: RE | Admit: 2013-12-18 | Discharge: 2013-12-18 | Disposition: A | Discharge status: Home / Self Care | Payer: Medicaid Other | Source: Ambulatory Visit | Attending: Radiation Oncology | Admitting: Radiation Oncology

## 2013-12-18 DIAGNOSIS — Z510 Encounter for antineoplastic radiation therapy: Secondary | ICD-10-CM | POA: Diagnosis not present

## 2013-12-19 ENCOUNTER — Ambulatory Visit
Admission: RE | Admit: 2013-12-19 | Discharge: 2013-12-19 | Disposition: A | Discharge status: Home / Self Care | Payer: Medicaid Other | Source: Ambulatory Visit | Attending: Radiation Oncology | Admitting: Radiation Oncology

## 2013-12-19 ENCOUNTER — Encounter: Payer: Self-pay | Admitting: Radiation Oncology

## 2013-12-19 DIAGNOSIS — Z510 Encounter for antineoplastic radiation therapy: Secondary | ICD-10-CM | POA: Diagnosis not present

## 2013-12-23 NOTE — Progress Notes (Signed)
  Radiation Oncology         [phone] ________________________________  Name: Gabriel Newman  MRN: [id number]  Date: 12/19/2013  DOB: Jan 19, 1933  End of Treatment Note  Diagnosis:   Stage T1c adenocarcinoma of the prostate with a Gleason's Score of 3+4 and a PSA of 19.25 - Intermediate Risk  Indication for treatment:  Curative, IMRT       Radiation treatment dates:   10/24/2013-12/19/2013  Site/dose:   The prostate was treated to 78 Gy in 40 fractions of 1.95 Gy  Beams/energy:   The patient was treated with IMRT using two volumetric arc beams delivering 6 MV X-rays.  Image guidance was performed with per fraction conebeam CT aligning to 3 intraprostatic gold fiducials and immobilization was achieved with BodyFix.  Narrative: The patient tolerated radiation treatment relatively well.   During his course, he reported nocturia x5, and a normal strong steady urine stream. He denied pain or fatigue. Weight and vitals were stable. Reports nocturia x 5. He took two colace tablets each night to stay regular  Plan: The patient has completed radiation treatment. The patient will return to radiation oncology clinic for routine followup in one month. I advised them to call or return sooner if they have any questions or concerns related to their recovery or treatment. ________________________________  Sheral Apley. Tammi Klippel, M.D.

## 2014-01-25 ENCOUNTER — Ambulatory Visit: Payer: Medicaid Other | Admitting: Radiation Oncology

## 2014-02-08 ENCOUNTER — Encounter: Payer: Self-pay | Admitting: Radiation Oncology

## 2014-02-08 ENCOUNTER — Ambulatory Visit
Admission: RE | Admit: 2014-02-08 | Discharge: 2014-02-08 | Disposition: A | Discharge status: Home / Self Care | Payer: Medicaid Other | Source: Ambulatory Visit | Attending: Radiation Oncology | Admitting: Radiation Oncology

## 2014-02-08 ENCOUNTER — Telehealth: Payer: Self-pay | Admitting: *Deleted

## 2014-02-08 VITALS — BP 134/64 | HR 75 | Resp 16 | Wt 118.3 lb

## 2014-02-08 DIAGNOSIS — C61 Malignant neoplasm of prostate: Secondary | ICD-10-CM

## 2014-02-08 NOTE — Progress Notes (Signed)
Radiation Oncology         [phone] ________________________________  Name: Gabriel Newman MRN: [id number]  Date: 02/08/2014  DOB: November 26, 1932  Follow-Up Visit Note  CC: No primary care provider on file.  Arvil Persons, MD  Diagnosis:   80 yo gentleman with stage T1c adenocarcinoma of the prostate with a Gleason's Score of 3+4 and a PSA of 19.25 - Intermediate Risk    ICD-9-CM ICD-10-CM   1. Stage T1c adenocarcinoma of the prostate with a Gleason's Score of 3+4 and a PSA of 19.25 - Intermediate Risk 185 C61    Interval Since Last Radiation:  6  weeks  Narrative:  The patient returns today for routine follow-up.  Patient recently returned from a month in [place] visiting with family. Patient states, "I feel great." Patient denies fatigue. Patient denies pain. Weight and vitals stable. Complains of nocturia x 6. Reports frequency. Denies dysuria. Describes a strong steady stream. Denies that he has to strain to void. Denies incontinence. Denies diarrhea. Reports a productive cough with clear to white sputum. Denies hemoptysis. Reports flomax didn't help with frequency so when he completed the last bottle he did not refill it.                               ALLERGIES:  has No Known Allergies.  Meds: Current Outpatient Prescriptions  Medication Sig Dispense Refill  . albuterol (PROVENTIL HFA;VENTOLIN HFA) 108 (90 BASE) MCG/ACT inhaler Inhale into the lungs every 6 (six) hours as needed for wheezing or shortness of breath.    Marland Kitchen buPROPion (WELLBUTRIN) 100 MG tablet   5  . ferrous sulfate 325 (65 FE) MG tablet Take 325 mg by mouth daily with breakfast.    . Fluticasone-Salmeterol (ADVAIR) 250-50 MCG/DOSE AEPB Inhale 1 puff into the lungs 2 (two) times daily.    Marland Kitchen glucosamine-chondroitin 500-400 MG tablet Take 1 tablet by mouth 3 (three) times daily.    . naproxen (NAPROSYN) 500 MG tablet   2  . omeprazole (PRILOSEC) 20 MG capsule   2  . pravastatin (PRAVACHOL) 20 MG tablet Take 20 mg by mouth  daily.    . chlorpheniramine-HYDROcodone (TUSSIONEX) 10-8 MG/5ML LQCR Take 5 mLs by mouth every 12 (twelve) hours as needed for cough. (Patient not taking: Reported on 02/08/2014) 115 mL 0  . tamsulosin (FLOMAX) 0.4 MG CAPS capsule Take 1 capsule (0.4 mg total) by mouth daily after supper. (Patient not taking: Reported on 02/08/2014) 30 capsule 5   No current facility-administered medications for this encounter.    Physical Findings: The patient is in no acute distress. Patient is alert and oriented.  weight is 118 lb 4.8 oz (53.661 kg). His blood pressure is 134/64 and his pulse is 75. His respiration is 16. .  No significant changes.  Impression:  The patient is recovering from the effects of radiation.    Plan:  Recommended metamucil for stool softener.  He will continue to follow-up with urology for ongoing PSA determinations.  I will look forward to following his response through their correspondence, and be happy to participate in care if clinically indicated.  I talked to the patient about what to expect in the future, including his risk for erectile dysfunction and rectal bleeding.  I encouraged him to call or return to the office if he has any question about his previous radiation or possible radiation effects.  He was comfortable with this plan.  _____________________________________  Sheral Apley Tammi Klippel, M.D.

## 2014-02-08 NOTE — Telephone Encounter (Signed)
CALLED PATIENT TO INFORM OF FU VISIT WITH DR. Janice Norrie ON 03/12/14- ARRIVAL TIME - 1 PM, LVM FOR A RETURN CALL, MAILED APPT. CARD

## 2014-02-08 NOTE — Progress Notes (Signed)
Patient recently returned from a month in [place] visiting with family. Patient states, "I feel great." Patient denies fatigue. Patient denies pain. Weight and vitals stable. Complains of nocturia x 6. Reports frequency. Denies dysuria. Describes a strong steady stream. Denies that he has to strain to void. Denies incontinence. Denies diarrhea. Reports a productive cough with clear to white sputum. Denies hemoptysis. Reports flomax didn't help with frequency so when he completed the last bottle he did not refill it.

## 2015-04-14 ENCOUNTER — Observation Stay (HOSPITAL_COMMUNITY)
Admission: EM | Admit: 2015-04-14 | Discharge: 2015-04-16 | Disposition: A | Discharge status: Home / Self Care | Payer: Medicaid Other | Attending: Internal Medicine | Admitting: Internal Medicine

## 2015-04-14 ENCOUNTER — Encounter (HOSPITAL_COMMUNITY): Payer: Self-pay | Admitting: Emergency Medicine

## 2015-04-14 ENCOUNTER — Emergency Department (HOSPITAL_COMMUNITY): Payer: Medicaid Other

## 2015-04-14 DIAGNOSIS — I200 Unstable angina: Secondary | ICD-10-CM | POA: Diagnosis not present

## 2015-04-14 DIAGNOSIS — J45909 Unspecified asthma, uncomplicated: Secondary | ICD-10-CM | POA: Diagnosis present

## 2015-04-14 DIAGNOSIS — K5900 Constipation, unspecified: Secondary | ICD-10-CM | POA: Diagnosis not present

## 2015-04-14 DIAGNOSIS — F17210 Nicotine dependence, cigarettes, uncomplicated: Secondary | ICD-10-CM | POA: Diagnosis not present

## 2015-04-14 DIAGNOSIS — Z79899 Other long term (current) drug therapy: Secondary | ICD-10-CM | POA: Diagnosis not present

## 2015-04-14 DIAGNOSIS — K2960 Other gastritis without bleeding: Secondary | ICD-10-CM | POA: Diagnosis not present

## 2015-04-14 DIAGNOSIS — K269 Duodenal ulcer, unspecified as acute or chronic, without hemorrhage or perforation: Secondary | ICD-10-CM | POA: Diagnosis not present

## 2015-04-14 DIAGNOSIS — Z923 Personal history of irradiation: Secondary | ICD-10-CM | POA: Insufficient documentation

## 2015-04-14 DIAGNOSIS — K219 Gastro-esophageal reflux disease without esophagitis: Secondary | ICD-10-CM | POA: Diagnosis present

## 2015-04-14 DIAGNOSIS — I209 Angina pectoris, unspecified: Secondary | ICD-10-CM

## 2015-04-14 DIAGNOSIS — R001 Bradycardia, unspecified: Secondary | ICD-10-CM | POA: Diagnosis present

## 2015-04-14 DIAGNOSIS — R079 Chest pain, unspecified: Secondary | ICD-10-CM | POA: Insufficient documentation

## 2015-04-14 DIAGNOSIS — E871 Hypo-osmolality and hyponatremia: Secondary | ICD-10-CM | POA: Diagnosis not present

## 2015-04-14 DIAGNOSIS — C61 Malignant neoplasm of prostate: Secondary | ICD-10-CM | POA: Diagnosis present

## 2015-04-14 DIAGNOSIS — D509 Iron deficiency anemia, unspecified: Secondary | ICD-10-CM | POA: Diagnosis present

## 2015-04-14 DIAGNOSIS — Z8546 Personal history of malignant neoplasm of prostate: Secondary | ICD-10-CM | POA: Diagnosis not present

## 2015-04-14 DIAGNOSIS — Z791 Long term (current) use of non-steroidal anti-inflammatories (NSAID): Secondary | ICD-10-CM | POA: Diagnosis not present

## 2015-04-14 DIAGNOSIS — R0600 Dyspnea, unspecified: Secondary | ICD-10-CM | POA: Diagnosis present

## 2015-04-14 DIAGNOSIS — R195 Other fecal abnormalities: Secondary | ICD-10-CM | POA: Diagnosis not present

## 2015-04-14 DIAGNOSIS — R0602 Shortness of breath: Secondary | ICD-10-CM | POA: Diagnosis not present

## 2015-04-14 DIAGNOSIS — J45901 Unspecified asthma with (acute) exacerbation: Secondary | ICD-10-CM

## 2015-04-14 LAB — CBC
HCT: 29.2 % — ABNORMAL LOW (ref 39.0–52.0)
HCT: 28.6 % — ABNORMAL LOW (ref 39.0–52.0)
HEMOGLOBIN: 10.3 g/dL — AB (ref 13.0–17.0)
Hemoglobin: 9.9 g/dL — ABNORMAL LOW (ref 13.0–17.0)
MCH: 29.9 pg (ref 26.0–34.0)
MCH: 29.2 pg (ref 26.0–34.0)
MCHC: 35.3 g/dL (ref 30.0–36.0)
MCHC: 34.6 g/dL (ref 30.0–36.0)
MCV: 84.6 fL (ref 78.0–100.0)
MCV: 84.4 fL (ref 78.0–100.0)
PLATELETS: 225 10*3/uL (ref 150–400)
PLATELETS: 218 10*3/uL (ref 150–400)
RBC: 3.45 MIL/uL — AB (ref 4.22–5.81)
RBC: 3.39 MIL/uL — AB (ref 4.22–5.81)
RDW: 12.2 % (ref 11.5–15.5)
RDW: 12.1 % (ref 11.5–15.5)
WBC: 9.5 10*3/uL (ref 4.0–10.5)
WBC: 7.6 10*3/uL (ref 4.0–10.5)

## 2015-04-14 LAB — TROPONIN I: Troponin I: <0.03 ng/mL (ref <0.031)

## 2015-04-14 LAB — FERRITIN: Ferritin: 10 ng/mL — ABNORMAL LOW (ref 24–336)

## 2015-04-14 LAB — DIFFERENTIAL
Basophils Absolute: 0.1 10*3/uL (ref 0.0–0.1)
Basophils Relative: 1 %
EOS PCT: 2 %
Eosinophils Absolute: 0.1 10*3/uL (ref 0.0–0.7)
LYMPHS ABS: 2 10*3/uL (ref 0.7–4.0)
LYMPHS PCT: 21 %
MONO ABS: 1.2 10*3/uL — AB (ref 0.1–1.0)
Monocytes Relative: 13 %
Neutro Abs: 6 10*3/uL (ref 1.7–7.7)
Neutrophils Relative %: 63 %

## 2015-04-14 LAB — RETICULOCYTES
RBC.: 3.39 MIL/uL — AB (ref 4.22–5.81)
RETIC CT PCT: 1.4 % (ref 0.4–3.1)
Retic Count, Absolute: 47.5 10*3/uL (ref 19.0–186.0)

## 2015-04-14 LAB — URINE MICROSCOPIC-ADD ON
BACTERIA UA: NONE SEEN
RBC / HPF: NONE SEEN RBC/hpf (ref 0–5)
SQUAMOUS EPITHELIAL / LPF: NONE SEEN
WBC, UA: NONE SEEN WBC/hpf (ref 0–5)

## 2015-04-14 LAB — IRON AND TIBC
Iron: 33 ug/dL — ABNORMAL LOW (ref 45–182)
SATURATION RATIOS: 8 % — AB (ref 17.9–39.5)
TIBC: 428 ug/dL (ref 250–450)
UIBC: 395 ug/dL

## 2015-04-14 LAB — BASIC METABOLIC PANEL
ANION GAP: 10 (ref 5–15)
BUN: 16 mg/dL (ref 6–20)
CHLORIDE: 96 mmol/L — AB (ref 101–111)
CO2: 21 mmol/L — AB (ref 22–32)
Calcium: 9.2 mg/dL (ref 8.9–10.3)
Creatinine, Ser: 1.01 mg/dL (ref 0.61–1.24)
GFR calc non Af Amer: >60 mL/min (ref >60)
Glucose, Bld: 134 mg/dL — ABNORMAL HIGH (ref 65–99)
Potassium: 4.7 mmol/L (ref 3.5–5.1)
Sodium: 127 mmol/L — ABNORMAL LOW (ref 135–145)

## 2015-04-14 LAB — URINALYSIS, ROUTINE W REFLEX MICROSCOPIC
Bilirubin Urine: NEGATIVE
GLUCOSE, UA: NEGATIVE mg/dL
HGB URINE DIPSTICK: NEGATIVE
Ketones, ur: NEGATIVE mg/dL
Nitrite: NEGATIVE
Protein, ur: NEGATIVE mg/dL
SPECIFIC GRAVITY, URINE: 1.018 (ref 1.005–1.030)
pH: 6.5 (ref 5.0–8.0)

## 2015-04-14 LAB — BRAIN NATRIURETIC PEPTIDE: B Natriuretic Peptide: 215.7 pg/mL — ABNORMAL HIGH (ref 0.0–100.0)

## 2015-04-14 LAB — FOLATE: Folate: 31.4 ng/mL (ref >5.9)

## 2015-04-14 LAB — SODIUM, URINE, RANDOM: SODIUM UR: 61 mmol/L

## 2015-04-14 LAB — OCCULT BLOOD X 1 CARD TO LAB, STOOL: FECAL OCCULT BLD: POSITIVE — AB

## 2015-04-14 LAB — TSH: TSH: 0.254 u[IU]/mL — ABNORMAL LOW (ref 0.350–4.500)

## 2015-04-14 LAB — VITAMIN B12: VITAMIN B 12: 553 pg/mL (ref 180–914)

## 2015-04-14 LAB — PROTIME-INR
INR: 0.96 (ref 0.00–1.49)
PROTHROMBIN TIME: 13 s (ref 11.6–15.2)

## 2015-04-14 LAB — APTT: aPTT: 37 seconds (ref 24–37)

## 2015-04-14 LAB — OSMOLALITY: OSMOLALITY: 274 mosm/kg — AB (ref 275–295)

## 2015-04-14 LAB — OSMOLALITY, URINE: OSMOLALITY UR: 554 mosm/kg (ref 300–900)

## 2015-04-14 LAB — D-DIMER, QUANTITATIVE (NOT AT ARMC): D DIMER QUANT: 1.24 ug{FEU}/mL — AB (ref 0.00–0.50)

## 2015-04-14 LAB — MRSA PCR SCREENING: MRSA by PCR: NEGATIVE

## 2015-04-14 MED ORDER — MOMETASONE FURO-FORMOTEROL FUM 100-5 MCG/ACT IN AERO
2.0000 | INHALATION_SPRAY | Freq: Two times a day (BID) | RESPIRATORY_TRACT | Status: DC
  Administered 2015-04-14 – 2015-04-16 (×4): 2 via RESPIRATORY_TRACT
  Filled 2015-04-14 (×2): qty 8.8

## 2015-04-14 MED ORDER — ASPIRIN 81 MG PO CHEW
324.0000 mg | CHEWABLE_TABLET | Freq: Once | ORAL | Status: AC
  Administered 2015-04-14: 324 mg via ORAL

## 2015-04-14 MED ORDER — PRAVASTATIN SODIUM 20 MG PO TABS
20.0000 mg | ORAL_TABLET | Freq: Every day | ORAL | Status: DC
  Administered 2015-04-14 – 2015-04-16 (×3): 20 mg via ORAL
  Filled 2015-04-14 (×3): qty 1

## 2015-04-14 MED ORDER — METOPROLOL TARTRATE 25 MG PO TABS
25.0000 mg | ORAL_TABLET | Freq: Two times a day (BID) | ORAL | Status: DC
Start: 2015-04-14 — End: 2015-04-15
  Administered 2015-04-14 (×2): 25 mg via ORAL
  Filled 2015-04-14 (×2): qty 1

## 2015-04-14 MED ORDER — PANTOPRAZOLE SODIUM 40 MG PO TBEC
40.0000 mg | DELAYED_RELEASE_TABLET | Freq: Every day | ORAL | Status: DC
  Administered 2015-04-14 – 2015-04-16 (×3): 40 mg via ORAL
  Filled 2015-04-14 (×3): qty 1

## 2015-04-14 MED ORDER — SODIUM CHLORIDE 0.9 % IV SOLN
INTRAVENOUS | Status: DC
  Administered 2015-04-14: 75 mL/h via INTRAVENOUS
  Administered 2015-04-14: 10:00:00 via INTRAVENOUS

## 2015-04-14 MED ORDER — ONDANSETRON HCL 4 MG/2ML IJ SOLN: 4.0000 mg | Freq: Four times a day (QID) | INTRAMUSCULAR | Status: DC | PRN

## 2015-04-14 MED ORDER — MAGNESIUM HYDROXIDE 400 MG/5ML PO SUSP
30.0000 mL | Freq: Every day | ORAL | Status: AC
  Administered 2015-04-14 – 2015-04-15 (×2): 30 mL via ORAL
  Filled 2015-04-14 (×2): qty 30

## 2015-04-14 MED ORDER — ACETAMINOPHEN 325 MG PO TABS: 650.0000 mg | ORAL_TABLET | ORAL | Status: DC | PRN

## 2015-04-14 MED ORDER — CLOPIDOGREL BISULFATE 75 MG PO TABS
300.0000 mg | ORAL_TABLET | Freq: Once | ORAL | Status: AC
  Administered 2015-04-14: 300 mg via ORAL
  Filled 2015-04-14 (×2): qty 4

## 2015-04-14 MED ORDER — TAMSULOSIN HCL 0.4 MG PO CAPS
0.4000 mg | ORAL_CAPSULE | Freq: Every day | ORAL | Status: DC
  Administered 2015-04-14 – 2015-04-16 (×3): 0.4 mg via ORAL
  Filled 2015-04-14 (×3): qty 1

## 2015-04-14 MED ORDER — ENOXAPARIN SODIUM 40 MG/0.4ML ~~LOC~~ SOLN
40.0000 mg | SUBCUTANEOUS | Status: DC
  Administered 2015-04-14: 40 mg via SUBCUTANEOUS
  Filled 2015-04-14: qty 0.4

## 2015-04-14 MED ORDER — HEPARIN BOLUS VIA INFUSION
2000.0000 [IU] | Freq: Once | INTRAVENOUS | Status: AC
  Administered 2015-04-14: 2000 [IU] via INTRAVENOUS
  Filled 2015-04-14: qty 2000

## 2015-04-14 MED ORDER — NITROGLYCERIN 0.4 MG SL SUBL: 0.4000 mg | SUBLINGUAL_TABLET | SUBLINGUAL | Status: DC | PRN

## 2015-04-14 MED ORDER — ALBUTEROL SULFATE (2.5 MG/3ML) 0.083% IN NEBU: 3.0000 mL | INHALATION_SOLUTION | Freq: Four times a day (QID) | RESPIRATORY_TRACT | Status: DC | PRN

## 2015-04-14 MED ORDER — HEPARIN (PORCINE) IN NACL 100-0.45 UNIT/ML-% IJ SOLN
600.0000 [IU]/h | INTRAMUSCULAR | Status: DC
  Administered 2015-04-14: 600 [IU]/h via INTRAVENOUS
  Filled 2015-04-14: qty 250

## 2015-04-14 MED ORDER — ASPIRIN EC 81 MG PO TBEC
81.0000 mg | DELAYED_RELEASE_TABLET | Freq: Every day | ORAL | Status: DC
  Administered 2015-04-15 – 2015-04-16 (×2): 81 mg via ORAL
  Filled 2015-04-14 (×2): qty 1

## 2015-04-14 NOTE — ED Provider Notes (Signed)
CSN: [id number]     Arrival date & time 04/14/15  0135 History   By signing my name below, I, Gabriel Newman, attest that this documentation has been prepared under the direction and in the presence of Ripley Fraise, MD.  Electronically Signed: Forrestine Newman, ED Scribe. 04/14/2015. 1:57 AM.   Chief Complaint  Patient presents with  . Code STEMI   Patient is a 82 y.o. male presenting with chest pain. The history is provided by the patient and the EMS personnel. A language interpreter was used [id number]).  Chest Pain Pain location:  L chest Pain radiates to:  Does not radiate Pain radiates to the back: no   Pain severity:  Moderate Onset quality:  Gradual Duration:  1 week Timing:  Constant Progression:  Worsening Chronicity:  New Relieved by:  Nothing Worsened by:  Nothing tried Associated symptoms: shortness of breath   Associated symptoms: no cough, no fever, no headache, no nausea and not vomiting     HPI Comments: Gabriel Newman brought in by EMS is a 82 y.o. male with a PMHx of prostate cancer and GERD who presents to the Emergency Department here for a possible code STEMI this evening. Pt reported a 3 day history of L sided chest pain to a neighbor/relative. He also reports associated shortness of breath, constipation, and intermittent subjective fever. Last bowel movement 1-2 days ago. Pt was given Solu-Medrol and Duoneb en route to department. No prior history of cardiac issues. Pt was prescribed a medication for a recent fever approximately 1 week ago. No known allergies to medications. No recent falls/trauma PCP: No primary care provider on file.    Past Medical History  Diagnosis Date  . Prostate cancer   . Arthritis   . Asthma   . GERD (gastroesophageal reflux disease)    Past Surgical History  Procedure Laterality Date  . Prostate biopsy    . Prostate biopsy     Family History  Problem Relation Age of Onset  . Cancer Neg Hx    Social History  Substance Use Topics  .  Smoking status: Current Every Day Smoker -- 0.25 packs/day for 60 years  . Smokeless tobacco: Never Used  . Alcohol Use: No    Review of Systems  Constitutional: Negative for fever and chills.  Respiratory: Positive for shortness of breath. Negative for cough.   Cardiovascular: Positive for chest pain.  Gastrointestinal: Positive for constipation. Negative for nausea, vomiting, diarrhea and blood in stool.  Neurological: Negative for headaches.  Psychiatric/Behavioral: Negative for confusion.  All other systems reviewed and are negative.     Allergies  Review of patient's allergies indicates no known allergies.  Home Medications   Prior to Admission medications   Medication Sig Start Date End Date Taking? Authorizing Provider  albuterol (PROVENTIL HFA;VENTOLIN HFA) 108 (90 BASE) MCG/ACT inhaler Inhale into the lungs every 6 (six) hours as needed for wheezing or shortness of breath.    Historical Provider, MD  buPROPion [facility]) 100 MG tablet  12/19/13   Historical Provider, MD  chlorpheniramine-HYDROcodone (TUSSIONEX) 10-8 MG/5ML LQCR Take 5 mLs by mouth every 12 (twelve) hours as needed for cough. Patient not taking: Reported on 02/08/2014 11/30/13   Tyler Pita, MD  ferrous sulfate 325 (65 FE) MG tablet Take 325 mg by mouth daily with breakfast.    Historical Provider, MD  Fluticasone-Salmeterol (ADVAIR) 250-50 MCG/DOSE AEPB Inhale 1 puff into the lungs 2 (two) times daily.    Historical Provider, MD  glucosamine-chondroitin 500-400  MG tablet Take 1 tablet by mouth 3 (three) times daily.    Historical Provider, MD  naproxen (NAPROSYN) 500 MG tablet  12/15/13   Historical Provider, MD  omeprazole (PRILOSEC) 20 MG capsule  12/15/13   Historical Provider, MD  pravastatin (PRAVACHOL) 20 MG tablet Take 20 mg by mouth daily.    Historical Provider, MD  tamsulosin (FLOMAX) 0.4 MG CAPS capsule Take 1 capsule (0.4 mg total) by mouth daily after supper. Patient not taking: Reported on  02/08/2014 11/23/13   Tyler Pita, MD   Triage Vitals: BP 148/69 mmHg  Pulse 62  Temp(Src) 98.4 F (36.9 C) (Oral)  Resp 16  Ht 5\' 4"  (1.626 m)  Wt 115 lb (52.164 kg)  BMI 19.73 kg/m2  SpO2 100%   Physical Exam  CONSTITUTIONAL: Elderly  HEAD: Normocephalic/atraumatic EYES: EOMI ENMT: Mucous membranes moist. Questionable bruising to L face (pt reports chronic) NECK: supple no meningeal signs SPINE/BACK:entire spine nontender CV: S1/S2 noted, no murmurs/rubs/gallops noted LUNGS: Wheezing bilaterally  ABDOMEN: soft, nontender, no rebound or guarding NEURO: Pt is awake/alert/appropriate, moves all extremitiesx4.  EXTREMITIES: pulses normal/equal, full ROM SKIN: warm, color normal PSYCH: Unable to assess   ED Course  Procedures  DIAGNOSTIC STUDIES: Oxygen Saturation is 100% on RA, Normal by my interpretation.    COORDINATION OF CARE: 1:51 AM- Will give ASA, Will order CXR, Troponin I, CBC, PT-INR, APTT, BMP, and EKG. Discussed treatment plan with pt at bedside and pt agreed to plan.    2:06 AM- Cardiology came down to see pt and they will admit. Seen by dr Susy Manor We reviewed ekg At this point they recommend cancelling code stemi They will admit patient Will only cath if troponin elevated  Pt stable at  This time Will be admitted to cardiology service  Labs Review Labs Reviewed  CBC - Abnormal; Notable for the following:    RBC 3.45 (*)    Hemoglobin 10.3 (*)    HCT 29.2 (*)    All other components within normal limits  DIFFERENTIAL - Abnormal; Notable for the following:    Monocytes Absolute 1.2 (*)    All other components within normal limits  BASIC METABOLIC PANEL - Abnormal; Notable for the following:    Sodium 127 (*)    Chloride 96 (*)    CO2 21 (*)    Glucose, Bld 134 (*)    All other components within normal limits  PROTIME-INR  APTT  TROPONIN I    Imaging Review Dg Chest Portable 1 View  04/14/2015  CLINICAL DATA:  Code ST-elevation MI. EXAM:  PORTABLE CHEST 1 VIEW COMPARISON:  07/13/2008 FINDINGS: The cardiomediastinal contours are normal. Mild atherosclerosis of the aortic arch. The lungs are clear. Pulmonary vasculature is normal. No consolidation, pleural effusion, or pneumothorax. No acute osseous abnormalities are seen. IMPRESSION: No acute pulmonary process. Electronically Signed   By: Jeb Levering M.D.   On: 04/14/2015 02:31   I have personally reviewed and evaluated these images and lab results as part of my medical decision-making.   EKG Interpretation   Date/Time:  Sunday April 14 2015 01:41:13 EST Ventricular Rate:  60 PR Interval:  181 QRS Duration: 87 QT Interval:  400 QTC Calculation: 400 R Axis:   81 Text Interpretation:  Sinus or ectopic atrial rhythm Borderline right axis  deviation st elevation in inferior leads Abnormal ekg Confirmed by  Christy Gentles  MD, Miyana Mordecai ([id number]) on 04/14/2015 1:43:37 AM     Medications  aspirin chewable tablet 324 mg (  324 mg Oral Given 04/14/15 0201)    MDM   Final diagnoses:  Ischemic chest pain ([place])  Asthma attack    Nursing notes including past medical history and social history reviewed and considered in documentation xrays/imaging reviewed by myself and considered during evaluation Labs/vital reviewed myself and considered during evaluation   I personally performed the services described in this documentation, which was scribed in my presence. The recorded information has been reviewed and is accurate.       Ripley Fraise, MD 04/14/15 [phone]

## 2015-04-14 NOTE — ED Notes (Signed)
Attempted report 

## 2015-04-14 NOTE — Progress Notes (Signed)
Patient ambulated in hall with RN. Patient steady on feet. Pt oxygen saturation 98-99% on room air ambulating and at rest. Heart rate 85-91. Regular respiratory effort. No Chest pain, no shortness of breath. Patient laughing and smiling.

## 2015-04-14 NOTE — Consult Note (Signed)
[facility] Medical Consultation  Gabriel Newman [id number] DOB: 1932-09-21 DOA: 04/14/2015 PCP: No primary care provider on file.   Requesting physician: Wynonia Lawman Date of consultation: 04/14/2015 Reason for consultation: sob, anemia, hyponatremia  Impression/Recommendations Principal Problem:   Dyspnea Active Problems:   GERD (gastroesophageal reflux disease)   Asthma, chronic   Adenocarcinoma of prostate ([place])   Chest pain, rule out acute myocardial infarction   Anemia   Hyponatremia   Constipation   Sinus bradycardia   #1. Shortness of breath. Likely related to anemia with possible mild asthma exacerbation. So reported subjective fever considering an infectious process. Chest x-ray without infiltrate or hyperinflation. No relief from inhalers. Oxygen saturation level greater than 95% on room air.  -Obtain urinalysis -Continue home inhalers -Provide nebulizers -Anemia panel -FOBT -Ambulate monitoring oxygen saturation level -May need GI consult  #2. Hyponatremia. Etiology uncertain. Patient is alert. We dry or wet. No decreased oral intake -Serum osmolality -Urine sodium and urine osmolality -TSH -Gentle IV fluids  #3. Anemia. Symptomatic. Patient denies ever being told he had anemia in the past. Hemoglobin 9.9. No signs symptoms of active bleeding. He does report some melena. Reports having had a colonoscopy [place] several years ago and states results were "normal" -Anemia panel -FOBT -Consider GI consult  #4. Constipation. Patient reports several days since effective bowel movement. Reports difficulty evacuating stool last evening. -Milk of Magnesia -Consider daily stool softener  #5. History of asthma. He is on Advair daily and nebs as needed. See #1 -Resume Advair -Nebulizers  #6. Sinus bradycardia. Defer to cardiology  [place] will follow up tomorrow. Please contact me if I can be of assistance in the meanwhile. Thank you for this consultation.  Chief  Complaint: sob  HPI:  Gabriel Newman is an 82 year old male with a past medical history that includes prostate cancer has completed radiation, asthma since emergency Department chief complaint shortness of breath and chest pain. He was admitted cardiology service as some concern for ACS heparin started troponin negative and EKG without acute changes. Workup also revealed hyponatremia anemia mild hyperglycemia. Patient is from [place] and does not speak English upon further evaluation patient denied chest pain. We are asked to consult for medical issues  Patient reports 3 day history of gradual worsening shortness of breath particularly with activity. Associated symptoms include general fatigue, constipation, melena some dizziness with position change. In addition he reports subjective fever 2 days ago. He reports using his inhaler with little relief. He denies chest pain palpitation, or extremity edema or orthopnea, headache visual disturbances syncope or near-syncope. He denies abdominal pain nausea vomiting. He denies decreased appetite or unintentional weight loss. He does endorse some frequency with urination as well as difficulty initiating flow.  Of note patient does have a primary care provider who he sees every 3 months  Since admission he has remained afebrile hemodynamically stable and not hypoxic.  Review of Systems:  10 point review of systems completed through the interpreter all systems are negative except as indicated in the history of present illness  Past Medical History  Diagnosis Date  . Prostate cancer ([place])   . Arthritis   . Asthma   . GERD (gastroesophageal reflux disease)    Past Surgical History  Procedure Laterality Date  . Prostate biopsy    . Prostate biopsy     Social History:  reports that he has been smoking.  He has never used smokeless tobacco. He reports that he does not drink alcohol or use illicit  drugs. He lives with his wife and son. He is from [place] he has  been here for 7 years. He reports planning to go back to [place] in 5 days permanently. No Known Allergies Family History  Problem Relation Age of Onset  . Cancer Neg Hx     Prior to Admission medications   Medication Sig Start Date End Date Taking? Authorizing Provider  acetaminophen (TYLENOL) 500 MG tablet Take 500 mg by mouth every 6 (six) hours as needed.   Yes Historical Provider, MD  albuterol (PROVENTIL HFA;VENTOLIN HFA) 108 (90 BASE) MCG/ACT inhaler Inhale into the lungs every 6 (six) hours as needed for wheezing or shortness of breath.   Yes Historical Provider, MD  Fluticasone-Salmeterol (ADVAIR) 250-50 MCG/DOSE AEPB Inhale 1 puff into the lungs 2 (two) times daily.   Yes Historical Provider, MD  ibuprofen (ADVIL,MOTRIN) 200 MG tablet Take 200 mg by mouth every 6 (six) hours as needed for fever or mild pain.   Yes Historical Provider, MD  UNABLE TO FIND Take 1 Dose by mouth daily as needed. Liquid medication for cough/sore throat/fever.   Yes Historical Provider, MD   Physical Exam: Blood pressure 134/65, pulse 62, temperature 98.4 F (36.9 C), temperature source Oral, resp. rate 18, height 5\' 4"  (1.626 m), weight 51.256 kg (113 lb), SpO2 98 %. Filed Vitals:   04/14/15 0342 04/14/15 1013  BP: 134/65   Pulse: 64 62  Temp: 98.4 F (36.9 C) 98.4 F (36.9 C)  Resp: 20 18     General:  Appears comfortable well-nourished does not speak English  Eyes: Pupils equal round reactive to light no scleral icterus EOMI  ENT: Ears clear nose without drainage oropharynx without erythema or exudate  Neck: No lymphadenopathy full range of motion  Cardiovascular: Regular rate and rhythm I hear no murmur gallop or rub no lower extremity edema  Respiratory: Normal effort at rest breath sounds slightly diminished but I hear no wheeze no rhonchi no crackles  Abdomen: Nondistended soft positive bowel sounds no guarding or rebounding  Skin: Warm and dry no rash  Musculoskeletal: Joints  without swelling/erythema full range of motion  Psychiatric: Cooperative smiling  Neurologic: Oriented to self and place follows commands  Labs on Admission:  Basic Metabolic Panel:  Recent Labs Lab 04/14/15 0132  NA 127*  K 4.7  CL 96*  CO2 21*  GLUCOSE 134*  BUN 16  CREATININE 1.01  CALCIUM 9.2   Liver Function Tests: No results for input(s): AST, ALT, ALKPHOS, BILITOT, PROT, ALBUMIN in the last 168 hours. No results for input(s): LIPASE, AMYLASE in the last 168 hours. No results for input(s): AMMONIA in the last 168 hours. CBC:  Recent Labs Lab 04/14/15 0132 04/14/15 0436  WBC 9.5 7.6  NEUTROABS 6.0  --   HGB 10.3* 9.9*  HCT 29.2* 28.6*  MCV 84.6 84.4  PLT 225 218   Cardiac Enzymes:  Recent Labs Lab 04/14/15 0132 04/14/15 0436  TROPONINI <0.03 <0.03   BNP: Invalid input(s): POCBNP CBG: No results for input(s): GLUCAP in the last 168 hours.  Radiological Exams on Admission: Dg Chest Portable 1 View  04/14/2015  CLINICAL DATA:  Code ST-elevation MI. EXAM: PORTABLE CHEST 1 VIEW COMPARISON:  07/13/2008 FINDINGS: The cardiomediastinal contours are normal. Mild atherosclerosis of the aortic arch. The lungs are clear. Pulmonary vasculature is normal. No consolidation, pleural effusion, or pneumothorax. No acute osseous abnormalities are seen. IMPRESSION: No acute pulmonary process. Electronically Signed   By: Fonnie Birkenhead.D.  On: 04/14/2015 02:31    EKG: Independently reviewed Sinus bradycardia Otherwise normal ECG  Time spent: 70 minutes  [facility]   If 7PM-7AM, please contact night-coverage [email or web address] Password [email or web address] 04/14/2015, 10:25 AM

## 2015-04-14 NOTE — Progress Notes (Signed)
Subjective:  History is obtained through a friend who is interpreter and speaks good [place].  Absolutely no chest pain prior to admission or currently.  The friend was actually surprised that he was said that he had chest pain.  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  He has a history of asthma and complains of some dyspnea.  Currently undergoing treatment for prostate cancer with radiation.  Objective:  Vital Signs in the last 24 hours: BP 134/65 mmHg  Pulse 64  Temp(Src) 98.4 F (36.9 C) (Oral)  Resp 20  Ht 5\' 4"  (1.626 m)  Wt 51.256 kg (113 lb)  BMI 19.39 kg/m2  SpO2 99%  Physical Exam: Thin elderly [place] male in no acute distress  Lungs:  Clear Cardiac:  Regular rhythm, normal S1 and S2, no S3 Extremities:  No edema present  Intake/Output from previous day: 02/04 0701 - 02/05 0700 In: 60 [P.O.:60] Out: 400 [Urine:400]  Weight Filed Weights   04/14/15 0147 04/14/15 0342  Weight: 52.164 kg (115 lb) 51.256 kg (113 lb)    Lab Results: Basic Metabolic Panel:  Recent Labs  04/14/15 0132  NA 127*  K 4.7  CL 96*  CO2 21*  GLUCOSE 134*  BUN 16  CREATININE 1.01   CBC:  Recent Labs  04/14/15 0132 04/14/15 0436  WBC 9.5 7.6  NEUTROABS 6.0  --   HGB 10.3* 9.9*  HCT 29.2* 28.6*  MCV 84.6 84.4  PLT 225 218   Cardiac Panel (last 3 results)  Recent Labs  04/14/15 0132 04/14/15 0436  TROPONINI <0.03 <0.03    Telemetry: Sinus rhythm   Assessment/Plan:   1.  Unexplained dyspnea, vague constitutional symptoms 2.  Hyponatremia 3.  Anemia  Recommendations:  EKG did not show any ischemic changes and enzymes are negative.  I'm going to stop his heparin.  Check a d-dimer.  Obtain echocardiogram and I will ask for an internal medicine consultation to evaluate the other symptoms.  Ambulate in hall.       Kerry Hough  MD [facility] Cardiology  04/14/2015, 9:09 AM

## 2015-04-14 NOTE — ED Notes (Signed)
NO ASA PTA

## 2015-04-14 NOTE — ED Notes (Signed)
Pt arrives via ems with c/o SHOB and CP x3 hours. Pt is non english speaking, no family at bedside presently. Called code stemi in the field, elevation present in V2, V3, AVF. Pt received duoneb and 125 mg solumedrol PTA, no cardiac drugs.

## 2015-04-14 NOTE — Progress Notes (Signed)
Pt family brought in paperwork from PCP and Colonoscopy results from 2013. RN made copies of paperwork and put in patients paper chart for MD. MD notified

## 2015-04-14 NOTE — Progress Notes (Signed)
ANTICOAGULATION CONSULT NOTE - Initial Consult  Pharmacy Consult for Heparin  Indication: chest pain/ACS  No Known Allergies  Patient Measurements: Height: 5\' 4"  (162.6 cm) Weight: 113 lb (51.256 kg) IBW/kg (Calculated) : 59.2  Vital Signs: Temp: 98.4 F (36.9 C) (02/05 0342) Temp Source: Oral (02/05 0342) BP: 134/65 mmHg (02/05 0342) Pulse Rate: 64 (02/05 0342)  Labs:  Recent Labs  04/14/15 0132  HGB 10.3*  HCT 29.2*  PLT 225  APTT 37  LABPROT 13.0  INR 0.96  CREATININE 1.01  TROPONINI <0.03    Estimated Creatinine Clearance: 42.3 mL/min (by C-G formula based on Cr of 1.01).   Medical History: Past Medical History  Diagnosis Date  . Prostate cancer ([place])   . Arthritis   . Asthma   . GERD (gastroesophageal reflux disease)      Assessment: 82 y/o M to start heparin for CP, CBC good, INR normal, renal function good, other labs reviewed.   Goal of Therapy:  Heparin level 0.3-0.7 units/ml Monitor platelets by anticoagulation protocol: Yes   Plan:  -Heparin 2000 units BOLUS -Start heparin drip at 600 units/hr -1200 HL -Daily CBC/HL -Monitor for bleeding  [place], [place] 04/14/2015,4:05 AM

## 2015-04-14 NOTE — ED Notes (Signed)
Cardiology at bedside, Dr. Christy Gentles interviewing patient via interpreter phone.

## 2015-04-14 NOTE — H&P (Signed)
CC; Left CP and SOB  Interpretor was used for this evaluation as patient does not speak English  HPI: 82 yo [place] man with history of Prostate cancer and no known CAD, presents with left sided CP and SOB. He reports being started on a prescription medication for fever about a week or so ago. SOB and CP started last night. CP is like ache, constant and located in the left precordial region. No exacerbating factors. SOB was brief last night. No orthopnea, PND, leg edema, bleeding, cough.   Code STEMI was called by EMS for concerns for ST elevation in inferior leads. Patient was evaluated promptly in ER.  ECG suggests benign early repolarization with pronounced notching in inferior leads. Code STEMI was cancelled.    Review of Systems:  10 systems reviewed unremarkable except as noted in HPI    Past Medical History  Diagnosis Date  . Prostate cancer ([place])   . Arthritis   . Asthma   . GERD (gastroesophageal reflux disease)    No current facility-administered medications on file prior to encounter.   Current Outpatient Prescriptions on File Prior to Encounter  Medication Sig Dispense Refill  . albuterol (PROVENTIL HFA;VENTOLIN HFA) 108 (90 BASE) MCG/ACT inhaler Inhale into the lungs every 6 (six) hours as needed for wheezing or shortness of breath.    Marland Kitchen buPROPion (WELLBUTRIN) 100 MG tablet   5  . chlorpheniramine-HYDROcodone (TUSSIONEX) 10-8 MG/5ML LQCR Take 5 mLs by mouth every 12 (twelve) hours as needed for cough. (Patient not taking: Reported on 02/08/2014) 115 mL 0  . ferrous sulfate 325 (65 FE) MG tablet Take 325 mg by mouth daily with breakfast.    . Fluticasone-Salmeterol (ADVAIR) 250-50 MCG/DOSE AEPB Inhale 1 puff into the lungs 2 (two) times daily.    Marland Kitchen glucosamine-chondroitin 500-400 MG tablet Take 1 tablet by mouth 3 (three) times daily.    . naproxen (NAPROSYN) 500 MG tablet   2  . omeprazole (PRILOSEC) 20 MG capsule   2  . pravastatin (PRAVACHOL) 20 MG tablet Take 20  mg by mouth daily.    . tamsulosin (FLOMAX) 0.4 MG CAPS capsule Take 1 capsule (0.4 mg total) by mouth daily after supper. (Patient not taking: Reported on 02/08/2014) 30 capsule 5     No Known Allergies  Social History   Social History  . Marital Status: Married    Spouse Name: N/A  . Number of Children: N/A  . Years of Education: N/A   Occupational History  . Not on file.   Social History Main Topics  . Smoking status: Current Every Day Smoker -- 0.25 packs/day for 60 years  . Smokeless tobacco: Never Used  . Alcohol Use: No  . Drug Use: No  . Sexual Activity: Not on file   Other Topics Concern  . Not on file   Social History Narrative    Family History  Problem Relation Age of Onset  . Cancer Neg Hx     PHYSICAL EXAM: Filed Vitals:   04/14/15 0146  BP: 148/69  Pulse: 62  Temp: 98.4 F (36.9 C)  Resp: 16   General:  Well appearing. No respiratory difficulty at rest at this time  HEENT: normal Neck: supple. no JVD. Carotids 2+ bilat; no bruits. No lymphadenopathy or thryomegaly appreciated. Cor: PMI nondisplaced. Regular rate & rhythm. No rubs, gallops or murmurs. Lungs: clear Abdomen: soft, nontender, nondistended. No hepatosplenomegaly. No bruits or masses. Good bowel sounds. Extremities: no cyanosis, clubbing, rash, edema Neuro: alert &  oriented x 3, cranial nerves grossly intact. moves all 4 extremities w/o difficulty. Affect pleasant. Skin - pigmentation over left cheek and left ear; some erythema over both clavicles   ECG: NSR, normal AV conduction, narrow QRS, evidence to suggest benign early repolarization  Results for orders placed or performed during the hospital encounter of 04/14/15 (from the past 24 hour(s))  CBC     Status: Abnormal   Collection Time: 04/14/15  1:32 AM  Result Value Ref Range   WBC 9.5 4.0 - 10.5 K/uL   RBC 3.45 (L) 4.22 - 5.81 MIL/uL   Hemoglobin 10.3 (L) 13.0 - 17.0 g/dL   HCT 29.2 (L) 39.0 - 52.0 %   MCV 84.6 78.0 -  100.0 fL   MCH 29.9 26.0 - 34.0 pg   MCHC 35.3 30.0 - 36.0 g/dL   RDW 12.2 11.5 - 15.5 %   Platelets 225 150 - 400 K/uL  Differential     Status: Abnormal   Collection Time: 04/14/15  1:32 AM  Result Value Ref Range   Neutrophils Relative % 63 %   Neutro Abs 6.0 1.7 - 7.7 K/uL   Lymphocytes Relative 21 %   Lymphs Abs 2.0 0.7 - 4.0 K/uL   Monocytes Relative 13 %   Monocytes Absolute 1.2 (H) 0.1 - 1.0 K/uL   Eosinophils Relative 2 %   Eosinophils Absolute 0.1 0.0 - 0.7 K/uL   Basophils Relative 1 %   Basophils Absolute 0.1 0.0 - 0.1 K/uL  Protime-INR     Status: None   Collection Time: 04/14/15  1:32 AM  Result Value Ref Range   Prothrombin Time 13.0 11.6 - 15.2 seconds   INR 0.96 0.00 - 1.49  APTT     Status: None   Collection Time: 04/14/15  1:32 AM  Result Value Ref Range   aPTT 37 24 - 37 seconds  Basic metabolic panel     Status: Abnormal   Collection Time: 04/14/15  1:32 AM  Result Value Ref Range   Sodium 127 (L) 135 - 145 mmol/L   Potassium 4.7 3.5 - 5.1 mmol/L   Chloride 96 (L) 101 - 111 mmol/L   CO2 21 (L) 22 - 32 mmol/L   Glucose, Bld 134 (H) 65 - 99 mg/dL   BUN 16 6 - 20 mg/dL   Creatinine, Ser 1.01 0.61 - 1.24 mg/dL   Calcium 9.2 8.9 - 10.3 mg/dL   GFR calc non Af Amer >60 >60 mL/min   GFR calc Af Amer >60 >60 mL/min   Anion gap 10 5 - 15  Troponin I     Status: None   Collection Time: 04/14/15  1:32 AM  Result Value Ref Range   Troponin I <0.03 <0.031 ng/mL   Dg Chest Portable 1 View  04/14/2015  CLINICAL DATA:  Code ST-elevation MI. EXAM: PORTABLE CHEST 1 VIEW COMPARISON:  07/13/2008 FINDINGS: The cardiomediastinal contours are normal. Mild atherosclerosis of the aortic arch. The lungs are clear. Pulmonary vasculature is normal. No consolidation, pleural effusion, or pneumothorax. No acute osseous abnormalities are seen. IMPRESSION: No acute pulmonary process. Electronically Signed   By: Jeb Levering M.D.   On: 04/14/2015 02:31     ASSESSMENT:  1.  Left sided CP and SOB - code STEMI was cancelled; evidence to suggest benign early repolarization which is more pronounced in inferior leads (has otching at J point)  --  - Initial TnI negative  - Bedside limited echo revealed normal LVEF and no  significant effusion    2. Elevated BP  - No history of HTN but he could have undiagnosed HTN with possibility of hypertensive heart disease    PLAN/DISCUSSION:   Admit to cardiology Cycle Tropl check full echo  Will treat him as Unstable angina with DAPT and Heparin for now; beta blocker Continue statin (moderate intensity due to age) BP control  Discussed with on call interventional cardiology (Dr. Gwenlyn Found)   Wandra Mannan, MD Cardiology

## 2015-04-15 DIAGNOSIS — E871 Hypo-osmolality and hyponatremia: Secondary | ICD-10-CM

## 2015-04-15 DIAGNOSIS — D509 Iron deficiency anemia, unspecified: Secondary | ICD-10-CM | POA: Diagnosis not present

## 2015-04-15 DIAGNOSIS — R0600 Dyspnea, unspecified: Secondary | ICD-10-CM | POA: Diagnosis not present

## 2015-04-15 DIAGNOSIS — R195 Other fecal abnormalities: Secondary | ICD-10-CM

## 2015-04-15 LAB — BASIC METABOLIC PANEL WITH GFR
Anion gap: 6 (ref 5–15)
BUN: 23 mg/dL — ABNORMAL HIGH (ref 6–20)
CO2: 23 mmol/L (ref 22–32)
Calcium: 8 mg/dL — ABNORMAL LOW (ref 8.9–10.3)
Chloride: 101 mmol/L (ref 101–111)
Creatinine, Ser: 1.06 mg/dL (ref 0.61–1.24)
GFR calc Af Amer: >60 mL/min
GFR calc non Af Amer: >60 mL/min
Glucose, Bld: 100 mg/dL — ABNORMAL HIGH (ref 65–99)
Potassium: 4.5 mmol/L (ref 3.5–5.1)
Sodium: 130 mmol/L — ABNORMAL LOW (ref 135–145)

## 2015-04-15 LAB — CBC
HEMATOCRIT: 24.5 % — AB (ref 39.0–52.0)
HEMOGLOBIN: 8.3 g/dL — AB (ref 13.0–17.0)
MCH: 29 pg (ref 26.0–34.0)
MCHC: 33.9 g/dL (ref 30.0–36.0)
MCV: 85.7 fL (ref 78.0–100.0)
Platelets: 186 10*3/uL (ref 150–400)
RBC: 2.86 MIL/uL — AB (ref 4.22–5.81)
RDW: 12.4 % (ref 11.5–15.5)
WBC: 8.2 10*3/uL (ref 4.0–10.5)

## 2015-04-15 LAB — HEMOGLOBIN A1C
HEMOGLOBIN A1C: 6 % — AB (ref 4.8–5.6)
MEAN PLASMA GLUCOSE: 126 mg/dL

## 2015-04-15 NOTE — Progress Notes (Signed)
TELEMETRY: Reviewed telemetry pt in NSR, sinus brady: Filed Vitals:   04/14/15 2200 04/14/15 2342 04/15/15 0337 04/15/15 0835  BP:  93/40 93/53   Pulse: 67 62 63   Temp:  98.1 F (36.7 C) 98.8 F (37.1 C)   TempSrc:  Oral Oral   Resp:   18   Height:      Weight:   52.3 kg (115 lb 4.8 oz)   SpO2:  99% 100% 98%    Intake/Output Summary (Last 24 hours) at 04/15/15 0851 Last data filed at 04/15/15 0600  Gross per 24 hour  Intake 2076.25 ml  Output    125 ml  Net 1951.25 ml   Filed Weights   04/14/15 0147 04/14/15 0342 04/15/15 0337  Weight: 52.164 kg (115 lb) 51.256 kg (113 lb) 52.3 kg (115 lb 4.8 oz)    Subjective Difficult to assess symptoms due to language barrier. Family member present with very limited [place].  Marland Kitchen aspirin EC  81 mg Oral Daily  . enoxaparin (LOVENOX) injection  40 mg Subcutaneous Q24H  . magnesium hydroxide  30 mL Oral Daily  . metoprolol tartrate  25 mg Oral BID  . mometasone-formoterol  2 puff Inhalation BID  . pantoprazole  40 mg Oral Daily  . pravastatin  20 mg Oral Daily  . tamsulosin  0.4 mg Oral QPC supper   . sodium chloride 75 mL/hr (04/14/15 2202)    LABS: Basic Metabolic Panel:  Recent Labs  04/14/15 0132 04/15/15 0558  NA 127* 130*  K 4.7 4.5  CL 96* 101  CO2 21* 23  GLUCOSE 134* 100*  BUN 16 23*  CREATININE 1.01 1.06  CALCIUM 9.2 8.0*   Liver Function Tests: No results for input(s): AST, ALT, ALKPHOS, BILITOT, PROT, ALBUMIN in the last 72 hours. No results for input(s): LIPASE, AMYLASE in the last 72 hours. CBC:  Recent Labs  04/14/15 0132 04/14/15 0436 04/15/15 0558  WBC 9.5 7.6 8.2  NEUTROABS 6.0  --   --   HGB 10.3* 9.9* 8.3*  HCT 29.2* 28.6* 24.5*  MCV 84.6 84.4 85.7  PLT 225 218 186   Cardiac Enzymes:  Recent Labs  04/14/15 0132 04/14/15 0436 04/14/15 0952  TROPONINI <0.03 <0.03 <0.03   BNP: No results for input(s): PROBNP in the last 72 hours. D-Dimer:  Recent Labs  04/14/15 0952  DDIMER  1.24*   Hemoglobin A1C: No results for input(s): HGBA1C in the last 72 hours. Fasting Lipid Panel: No results for input(s): CHOL, HDL, LDLCALC, TRIG, CHOLHDL, LDLDIRECT in the last 72 hours. Thyroid Function Tests:  Recent Labs  04/14/15 0952  TSH 0.254*     Radiology/Studies:  Dg Chest Portable 1 View  04/14/2015  CLINICAL DATA:  Code ST-elevation MI. EXAM: PORTABLE CHEST 1 VIEW COMPARISON:  07/13/2008 FINDINGS: The cardiomediastinal contours are normal. Mild atherosclerosis of the aortic arch. The lungs are clear. Pulmonary vasculature is normal. No consolidation, pleural effusion, or pneumothorax. No acute osseous abnormalities are seen. IMPRESSION: No acute pulmonary process. Electronically Signed   By: Jeb Levering M.D.   On: 04/14/2015 02:31    PHYSICAL EXAM General: Well developed, thin [place] male in NAD Head: Normal Neck: Negative for carotid bruits. JVD not elevated. No adenopathy Lungs: Clear bilaterally to auscultation without wheezes, rales, or rhonchi. Breathing is unlabored. Heart: RRR S1 S2 without murmurs, rubs, or gallops.  Abdomen: Soft, non-tender, non-distended with normoactive bowel sounds.  Extremities: No clubbing, cyanosis or edema.  Distal pedal pulses are  2+ and equal bilaterally. Neuro: Alert   ASSESSMENT AND PLAN: 1. Dyspnea. Initially called a Code STEMI but Ecg shows only early repolarization without serial changes. Troponins are all negative. CV risk is low. Plan Echo today. Would stop metoprolol due to ? Asthma and bradycardia on monitor.  2. Iron deficient anemia. Heme positive stool. Will DC lovenox SQ. Further work up per [facility] team.  3. Hyponatremia  No active cardiac issues. Will request [facility] to take on their service for further work up of primary care issues.  Present on Admission:  . Chest pain, rule out acute myocardial infarction . GERD (gastroesophageal reflux disease) . Asthma, chronic . Adenocarcinoma of  prostate ([place]) . Anemia . Hyponatremia . Dyspnea . Constipation . Sinus bradycardia  Signed, Mekaela Azizi [place], [place] 04/15/2015 8:51 AM

## 2015-04-15 NOTE — Progress Notes (Signed)
[facility] PROGRESS NOTE  Samin Marin Comment [id number] DOB: 11/23/1932 DOA: 04/14/2015 PCP: No primary care provider on file.  Assessment/Plan:  Principal Problem:   Dyspnea: echo pending. May be in part, anemia related. Active Problems:   Iron deficiency anemia with heme positive stool. H/h drop today and BUN increased. Had colonoscopy in 2013 by Dr. Benson [place] will consult to consider EGD, as h/h dropping (may be in part dilutional). Not stable for discharge   GERD (gastroesophageal reflux disease)   Asthma, chronic: no wheeze today   Adenocarcinoma of prostate ([place])   Hyponatremia: improved. Osmolalities consistent with SIADH. Stop IVF   Constipation   Sinus bradycardia   Heme positive stool  I can take patient on my service. Cardiology: please follow up echo results, as will I.  HPI/Subjective: Feeling better. No bleeding noted. Reports h/o peptic ulcers in the past, but unclear when he had EGD  Objective: Filed Vitals:   04/15/15 0933 04/15/15 0955  BP: 89/38 94/40  Pulse: 68   Temp: 98.4 F (36.9 C)   Resp: 18     Intake/Output Summary (Last 24 hours) at 04/15/15 1212 Last data filed at 04/15/15 0600  Gross per 24 hour  Intake 2076.25 ml  Output      0 ml  Net 2076.25 ml   Filed Weights   04/14/15 0147 04/14/15 0342 04/15/15 0337  Weight: 52.164 kg (115 lb) 51.256 kg (113 lb) 52.3 kg (115 lb 4.8 oz)    Exam:   General:  A and o. comfortable  Cardiovascular: RRR without MGR  Respiratory: CTA without WRR  Abdomen: S, NT, ND  Ext: no CCE  Basic Metabolic Panel:  Recent Labs Lab 04/14/15 0132 04/15/15 0558  NA 127* 130*  K 4.7 4.5  CL 96* 101  CO2 21* 23  GLUCOSE 134* 100*  BUN 16 23*  CREATININE 1.01 1.06  CALCIUM 9.2 8.0*   Liver Function Tests: No results for input(s): AST, ALT, ALKPHOS, BILITOT, PROT, ALBUMIN in the last 168 hours. No results for input(s): LIPASE, AMYLASE in the last 168 hours. No results for input(s): AMMONIA in the last  168 hours. CBC:  Recent Labs Lab 04/14/15 0132 04/14/15 0436 04/15/15 0558  WBC 9.5 7.6 8.2  NEUTROABS 6.0  --   --   HGB 10.3* 9.9* 8.3*  HCT 29.2* 28.6* 24.5*  MCV 84.6 84.4 85.7  PLT 225 218 186   Cardiac Enzymes:  Recent Labs Lab 04/14/15 0132 04/14/15 0436 04/14/15 0952  TROPONINI <0.03 <0.03 <0.03   BNP (last 3 results)  Recent Labs  04/14/15 0436  BNP 215.7*    ProBNP (last 3 results) No results for input(s): PROBNP in the last 8760 hours.  CBG: No results for input(s): GLUCAP in the last 168 hours.  Recent Results (from the past 240 hour(s))  MRSA PCR Screening     Status: None   Collection Time: 04/14/15  3:48 AM  Result Value Ref Range Status   MRSA by PCR NEGATIVE NEGATIVE Final    Comment:        The GeneXpert MRSA Assay (FDA approved for NASAL specimens only), is one component of a comprehensive MRSA colonization surveillance program. It is not intended to diagnose MRSA infection nor to guide or monitor treatment for MRSA infections.      Studies: Dg Chest Portable 1 View  04/14/2015  CLINICAL DATA:  Code ST-elevation MI. EXAM: PORTABLE CHEST 1 VIEW COMPARISON:  07/13/2008 FINDINGS: The cardiomediastinal contours are normal. Mild atherosclerosis of  the aortic arch. The lungs are clear. Pulmonary vasculature is normal. No consolidation, pleural effusion, or pneumothorax. No acute osseous abnormalities are seen. IMPRESSION: No acute pulmonary process. Electronically Signed   By: Jeb Levering M.D.   On: 04/14/2015 02:31    Scheduled Meds: . aspirin EC  81 mg Oral Daily  . mometasone-formoterol  2 puff Inhalation BID  . pantoprazole  40 mg Oral Daily  . pravastatin  20 mg Oral Daily  . tamsulosin  0.4 mg Oral QPC supper   Continuous Infusions: . sodium chloride 75 mL/hr (04/14/15 2202)    Time spent: 35 minutes  [facility] [email or web address], password [facility] 04/15/2015, 12:12 PM  LOS: 1 day

## 2015-04-15 NOTE — Consult Note (Signed)
Reason for Consult: Anemia Referring Physician: [facility]  Gabriel Newman HPI: This is an 82 year old male with a recent history of anemia secondary to a gastric ulcer, 08/2014, history of prostate cancer, arthritis, and asthma.  He presented with complaints of chest pain and SOB.  A code STEMI was initiated ax he had elevations in the ST segments of the inferior leads, but this turned out to be repolarization with pronounced notching in the inferior leads.  Over the course of his hospitalization he was noticed to have a progressive anemia.  With further questioning he does report black stools.  Last Summer he was evaluated in the office for findings of an anemia.  His HGB dropped from the 11 range down to the 7-8 range.  An EGD was significant for a small clean-based ulcer.  There was also some evidence of healing ulcers.  The biopsies were negative for H. Pylori.  PPI treatment ultimately brought his HGB back to his baseline.  Past Medical History  Diagnosis Date  . Prostate cancer ([place])   . Arthritis   . Asthma   . GERD (gastroesophageal reflux disease)     Past Surgical History  Procedure Laterality Date  . Prostate biopsy    . Prostate biopsy      Family History  Problem Relation Age of Onset  . Cancer Neg Hx     Social History:  reports that he has been smoking.  He has never used smokeless tobacco. He reports that he does not drink alcohol or use illicit drugs.  Allergies: No Known Allergies  Medications:  Scheduled: . aspirin EC  81 mg Oral Daily  . mometasone-formoterol  2 puff Inhalation BID  . pantoprazole  40 mg Oral Daily  . pravastatin  20 mg Oral Daily  . tamsulosin  0.4 mg Oral QPC supper   Continuous:   Results for orders placed or performed during the hospital encounter of 04/14/15 (from the past 24 hour(s))  CBC     Status: Abnormal   Collection Time: 04/15/15  5:58 AM  Result Value Ref Range   WBC 8.2 4.0 - 10.5 K/uL   RBC 2.86 (L) 4.22 - 5.81 MIL/uL   Hemoglobin 8.3 (L) 13.0 - 17.0 g/dL   HCT 24.5 (L) 39.0 - 52.0 %   MCV 85.7 78.0 - 100.0 fL   MCH 29.0 26.0 - 34.0 pg   MCHC 33.9 30.0 - 36.0 g/dL   RDW 12.4 11.5 - 15.5 %   Platelets 186 150 - 400 K/uL  Basic metabolic panel     Status: Abnormal   Collection Time: 04/15/15  5:58 AM  Result Value Ref Range   Sodium 130 (L) 135 - 145 mmol/L   Potassium 4.5 3.5 - 5.1 mmol/L   Chloride 101 101 - 111 mmol/L   CO2 23 22 - 32 mmol/L   Glucose, Bld 100 (H) 65 - 99 mg/dL   BUN 23 (H) 6 - 20 mg/dL   Creatinine, Ser 1.06 0.61 - 1.24 mg/dL   Calcium 8.0 (L) 8.9 - 10.3 mg/dL   GFR calc non Af Amer >60 >60 mL/min   GFR calc Af Amer >60 >60 mL/min   Anion gap 6 5 - 15     Dg Chest Portable 1 View  04/14/2015  CLINICAL DATA:  Code ST-elevation MI. EXAM: PORTABLE CHEST 1 VIEW COMPARISON:  07/13/2008 FINDINGS: The cardiomediastinal contours are normal. Mild atherosclerosis of the aortic arch. The lungs are clear. Pulmonary vasculature is  normal. No consolidation, pleural effusion, or pneumothorax. No acute osseous abnormalities are seen. IMPRESSION: No acute pulmonary process. Electronically Signed   By: Jeb Levering M.D.   On: 04/14/2015 02:31    ROS:  As stated above in the HPI otherwise negative.  Blood pressure 132/49, pulse 74, temperature 98.4 F (36.9 C), temperature source Oral, resp. rate 18, height 5\' 4"  (1.626 m), weight 52.3 kg (115 lb 4.8 oz), SpO2 100 %.    PE: Gen: NAD, Alert and Oriented HEENT:  [place]/AT, EOMI Neck: Supple, no LAD Lungs: CTA Bilaterally CV: RRR without M/G/R ABM: Soft, NTND, +BS Ext: No C/C/E  Assessment/Plan: 1) Anemia. 2) History of PUD. 3) Chest pain.   With the drop in his HGB, I will repeat his EGD.  The prior examination did reveal some small healing ulcers and a small 4 mm clean-based ulcer.  I cannot determine if he maintained his PPI over the past 6 months.  Plan: 1) EGD tomorrow.  Gabriel Newman D 04/15/2015, 9:12 PM

## 2015-04-15 NOTE — Progress Notes (Signed)
UR Completed Shamyah Stantz Graves-Bigelow, RN,BSN [phone]  

## 2015-04-16 ENCOUNTER — Observation Stay (HOSPITAL_BASED_OUTPATIENT_CLINIC_OR_DEPARTMENT_OTHER): Payer: Medicaid Other

## 2015-04-16 ENCOUNTER — Encounter (HOSPITAL_COMMUNITY): Payer: Self-pay | Admitting: *Deleted

## 2015-04-16 ENCOUNTER — Encounter (HOSPITAL_COMMUNITY)
Admission: EM | Disposition: A | Discharge status: Home / Self Care | Payer: Self-pay | Source: Home / Self Care | Attending: Emergency Medicine

## 2015-04-16 DIAGNOSIS — D509 Iron deficiency anemia, unspecified: Secondary | ICD-10-CM | POA: Diagnosis not present

## 2015-04-16 DIAGNOSIS — R0602 Shortness of breath: Secondary | ICD-10-CM | POA: Diagnosis not present

## 2015-04-16 DIAGNOSIS — R079 Chest pain, unspecified: Secondary | ICD-10-CM

## 2015-04-16 DIAGNOSIS — R195 Other fecal abnormalities: Secondary | ICD-10-CM | POA: Diagnosis not present

## 2015-04-16 DIAGNOSIS — R0600 Dyspnea, unspecified: Secondary | ICD-10-CM | POA: Diagnosis not present

## 2015-04-16 DIAGNOSIS — K219 Gastro-esophageal reflux disease without esophagitis: Secondary | ICD-10-CM | POA: Diagnosis not present

## 2015-04-16 DIAGNOSIS — J45909 Unspecified asthma, uncomplicated: Secondary | ICD-10-CM | POA: Diagnosis not present

## 2015-04-16 HISTORY — PX: ESOPHAGOGASTRODUODENOSCOPY: SHX5428

## 2015-04-16 LAB — BASIC METABOLIC PANEL
Anion gap: 9 (ref 5–15)
BUN: 16 mg/dL (ref 6–20)
CALCIUM: 8.5 mg/dL — AB (ref 8.9–10.3)
CO2: 24 mmol/L (ref 22–32)
CREATININE: 0.95 mg/dL (ref 0.61–1.24)
Chloride: 97 mmol/L — ABNORMAL LOW (ref 101–111)
GFR calc Af Amer: >60 mL/min (ref >60)
Glucose, Bld: 104 mg/dL — ABNORMAL HIGH (ref 65–99)
POTASSIUM: 4.2 mmol/L (ref 3.5–5.1)
SODIUM: 130 mmol/L — AB (ref 135–145)

## 2015-04-16 LAB — CBC
HCT: 26.8 % — ABNORMAL LOW (ref 39.0–52.0)
Hemoglobin: 8.7 g/dL — ABNORMAL LOW (ref 13.0–17.0)
MCH: 27.9 pg (ref 26.0–34.0)
MCHC: 32.5 g/dL (ref 30.0–36.0)
MCV: 85.9 fL (ref 78.0–100.0)
PLATELETS: 196 10*3/uL (ref 150–400)
RBC: 3.12 MIL/uL — AB (ref 4.22–5.81)
RDW: 12.5 % (ref 11.5–15.5)
WBC: 8 10*3/uL (ref 4.0–10.5)

## 2015-04-16 SURGERY — EGD (ESOPHAGOGASTRODUODENOSCOPY)
Anesthesia: Moderate Sedation

## 2015-04-16 MED ORDER — MIDAZOLAM HCL 10 MG/2ML IJ SOLN
INTRAMUSCULAR | Status: DC | PRN
  Administered 2015-04-16 (×2): 2 mg via INTRAVENOUS
  Administered 2015-04-16: 1 mg via INTRAVENOUS

## 2015-04-16 MED ORDER — FENTANYL CITRATE (PF) 100 MCG/2ML IJ SOLN
INTRAMUSCULAR | Status: AC
  Filled 2015-04-16: qty 2

## 2015-04-16 MED ORDER — PANTOPRAZOLE SODIUM 40 MG PO TBEC: 40.0000 mg | DELAYED_RELEASE_TABLET | Freq: Every day | ORAL | Status: AC

## 2015-04-16 MED ORDER — ACETAMINOPHEN 325 MG PO TABS: 650.0000 mg | ORAL_TABLET | Freq: Four times a day (QID) | ORAL | Status: AC | PRN

## 2015-04-16 MED ORDER — FENTANYL CITRATE (PF) 100 MCG/2ML IJ SOLN
INTRAMUSCULAR | Status: DC | PRN
  Administered 2015-04-16 (×2): 25 ug via INTRAVENOUS

## 2015-04-16 MED ORDER — FERROUS SULFATE 325 (65 FE) MG PO TABS: 325.0000 mg | ORAL_TABLET | Freq: Every day | ORAL | Status: AC

## 2015-04-16 MED ORDER — SODIUM CHLORIDE 0.9 % IV SOLN: INTRAVENOUS | Status: DC

## 2015-04-16 MED ORDER — MIDAZOLAM HCL 5 MG/ML IJ SOLN
INTRAMUSCULAR | Status: AC
  Filled 2015-04-16: qty 2

## 2015-04-16 MED ORDER — DIPHENHYDRAMINE HCL 50 MG/ML IJ SOLN
INTRAMUSCULAR | Status: AC
  Filled 2015-04-16: qty 1

## 2015-04-16 NOTE — Progress Notes (Signed)
Echocardiogram 2D Echocardiogram has been performed.  Tresa Res 04/16/2015, 12:02 PM

## 2015-04-16 NOTE — Discharge Summary (Signed)
Physician Discharge Summary  Gabriel Newman [id number] DOB: [birth year] DOA: 04/14/2015  PCP: No primary care provider on file.  Admit date: 04/14/2015 Discharge date: 04/16/2015  Time spent: *greater than 30 minuites  Recommendations for Outpatient Follow-up:  1. Monitor h/h 2. PPI indefinately 3. Avoid NSAIDs   Discharge Diagnoses:  Principal Problem:   Dyspnea Active Problems:   GERD (gastroesophageal reflux disease)   Asthma, chronic   Adenocarcinoma of prostate (HCC)   Iron deficiency anemia   Hyponatremia   Sinus bradycardia   Heme positive stool Duodenal ulcers, healing Antral gastritis  Discharge Condition: stable  Diet recommendation: general  Filed Weights   04/14/15 0342 04/15/15 0337 04/16/15 0557  Weight: 51.256 kg (113 lb) 52.3 kg (115 lb 4.8 oz) 51.393 kg (113 lb 4.8 oz)    History of present illness/hospital course 82 yo male who speaks only [place] presented to ED with dyspnea.  In the ED, there was reports of chest pain, and code STEMI called and patient admitted to cardiology.  Upon further questioning with interpreter, patient denied CP, and EKG changes consistent with early repolarization, so code STEMI cancelled. Patient reports dyspnea and insomnia.  Found to be anemic and hyponatremic, so hospitalists consulted. Echo with normal EF and no wall motion abnormalities.  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 Patient asymptomatic.  hgb did drop to 8 and stool heme positive, so GI consulted. Indices consistent with iron deficiency. Had previous colonoscopy and EGD with Dr.  Benson Norway.  Patient had repeat EGD which showed healing duodenal ulcers and antral gastritis, without bleeding. hgb remained stable, dypspnea improved. Started on iron at discharge, indefinate PPI, and avoid NSAIDS   Procedures:  EGD  Consultations:  [facility]  GI: Hung  Discharge Exam: Filed Vitals:   04/16/15 1600 04/16/15 1606   BP:  115/54  Pulse: 66 66  Temp:    Resp: 15 19    General: a and o Cardiovascular: RRR Respiratory: CTA abd s, nt, nd  Discharge Instructions   Discharge Instructions    Activity as tolerated - No restrictions    Complete by:  As directed      Activity as tolerated - No restrictions    Complete by:  As directed      Diet - low sodium heart healthy    Complete by:  As directed      Diet - low sodium heart healthy    Complete by:  As directed      Discharge instructions    Complete by:  As directed   Avoid ibuprofen            Medication List    STOP taking these medications        ibuprofen 200 MG tablet  Commonly known as:  ADVIL,MOTRIN     omeprazole 20 MG capsule  Commonly known as:  PRILOSEC  Replaced by:  pantoprazole 40 MG tablet     UNABLE TO FIND      TAKE these medications        acetaminophen 325 MG tablet  Commonly known as:  TYLENOL  Take 2 tablets (650 mg total) by mouth every 6 (six) hours as needed for mild pain.     albuterol 108 (90 Base) MCG/ACT inhaler  Commonly known as:  PROVENTIL HFA;VENTOLIN HFA  Inhale into the lungs every 6 (six) hours as needed for wheezing or shortness of breath.     ferrous sulfate 325 (65 FE)  MG tablet  Take 1 tablet (325 mg total) by mouth daily with breakfast.     Fluticasone-Salmeterol 250-50 MCG/DOSE Aepb  Commonly known as:  ADVAIR  Inhale 1 puff into the lungs 2 (two) times daily.     pantoprazole 40 MG tablet  Commonly known as:  PROTONIX  Take 1 tablet (40 mg total) by mouth daily.       No Known Allergies Follow-up Information    Follow up with your doctor.       The results of significant diagnostics from this hospitalization (including imaging, microbiology, ancillary and laboratory) are listed below for reference.    Significant Diagnostic Studies: Dg Chest Portable 1 View  04/14/2015  CLINICAL DATA:  Code ST-elevation MI. EXAM: PORTABLE CHEST 1 VIEW COMPARISON:  07/13/2008  FINDINGS: The cardiomediastinal contours are normal. Mild atherosclerosis of the aortic arch. The lungs are clear. Pulmonary vasculature is normal. No consolidation, pleural effusion, or pneumothorax. No acute osseous abnormalities are seen. IMPRESSION: No acute pulmonary process. Electronically Signed   By: Jeb Levering M.D.   On: 04/14/2015 02:31   Echo  - Left ventricle: The cavity size was normal. Wall thickness was normal. Systolic function was normal. The estimated ejection fraction was in the range of 60% to 65%. Wall motion was normal; there were no regional wall motion abnormalities. Doppler parameters are consistent with abnormal left ventricular relaxation (grade 1 diastolic dysfunction). - Aortic valve: There was no stenosis. - Mitral valve: There was no significant regurgitation. - Right ventricle: The cavity size was normal. Systolic function was normal. - Pulmonary arteries: No complete TR doppler jet so unable to estimate PA systolic pressure. - Inferior vena cava: The vessel was normal in size. The respirophasic diameter changes were in the normal range (>= 50%), consistent with normal central venous pressure.  Impressions:  - Normal LV size with EF 60-65%. Normal RV size and systolic function. No significant valvular abnormalities Microbiology: Recent Results (from the past 240 hour(s))  MRSA PCR Screening     Status: None   Collection Time: 04/14/15  3:48 AM  Result Value Ref Range Status   MRSA by PCR NEGATIVE NEGATIVE Final    Comment:        The GeneXpert MRSA Assay (FDA approved for NASAL specimens only), is one component of a comprehensive MRSA colonization surveillance program. It is not intended to diagnose MRSA infection nor to guide or monitor treatment for MRSA infections.      Labs: Basic Metabolic Panel:  Recent Labs Lab 04/14/15 0132 04/15/15 0558 04/16/15 0723  NA 127* 130* 130*  K 4.7 4.5 4.2  CL 96* 101  97*  CO2 21* 23 24  GLUCOSE 134* 100* 104*  BUN 16 23* 16  CREATININE 1.01 1.06 0.95  CALCIUM 9.2 8.0* 8.5*   Liver Function Tests: No results for input(s): AST, ALT, ALKPHOS, BILITOT, PROT, ALBUMIN in the last 168 hours. No results for input(s): LIPASE, AMYLASE in the last 168 hours. No results for input(s): AMMONIA in the last 168 hours. CBC:  Recent Labs Lab 04/14/15 0132 04/14/15 0436 04/15/15 0558 04/16/15 0723  WBC 9.5 7.6 8.2 8.0  NEUTROABS 6.0  --   --   --   HGB 10.3* 9.9* 8.3* 8.7*  HCT 29.2* 28.6* 24.5* 26.8*  MCV 84.6 84.4 85.7 85.9  PLT 225 218 186 196   Cardiac Enzymes:  Recent Labs Lab 04/14/15 0132 04/14/15 0436 04/14/15 0952  TROPONINI <0.03 <0.03 <0.03   BNP: BNP (last  3 results)  Recent Labs  04/14/15 0436  BNP 215.7*    ProBNP (last 3 results) No results for input(s): PROBNP in the last 8760 hours.  CBG: No results for input(s): GLUCAP in the last 168 hours.     Signed:  Delfina Redwood MD [facility] 04/16/2015, 4:47 PM

## 2015-04-16 NOTE — Plan of Care (Signed)
Problem: Safety: Goal: Ability to remain free from injury will improve Outcome: Completed/Met Date Met:  04/16/15 Pt educated on safety measures put into place. Pt verbalized understanding. Call bell within reach and bed alarm on.

## 2015-04-16 NOTE — Plan of Care (Signed)
Problem: Pain Managment: Goal: General experience of comfort will improve Outcome: Completed/Met Date Met:  04/16/15 Pt educated on pain scale and interventions. Pt verbalized understanding.

## 2015-04-16 NOTE — Op Note (Signed)
[facility] [place], [id number]   ENDOSCOPY PROCEDURE REPORT  PATIENT: Gabriel Newman, Gabriel Newman  MR#: [id number] BIRTHDATE: 1932/04/18 , 82  yrs. old GENDER: male ENDOSCOPIST:Dunya Meiners Benson Norway, MD REFERRED BY: PROCEDURE DATE:  04-27-2015 PROCEDURE:   EGD, diagnostic ASA CLASS:    Class III INDICATIONS:  GI bleed MEDICATION: Versed 5 mg IV and Fentanyl 50 mcg IV TOPICAL ANESTHETIC:   none  DESCRIPTION OF PROCEDURE:   After the risks and benefits of the procedure were explained, informed consent was obtained.  The PENTAX GASTOROSCOPE [id number]  endoscope was introduced through the mouth  and advanced to the second portion of the duodenum .  The instrument was slowly withdrawn as the mucosa was fully examined. Estimated blood loss is zero unless otherwise noted in this procedure report.   FINDINGS: The esophagus was normal.  In the gastric antrum there was some scarring from prior PUD and minor gastritis.  In the duodenal bulb and the second portion of the duodenum healing ulcers were identified in the setting of duodenitis.  Some of the ulcers were punctate and some were linear.  No evidence of active bleeding. Retroflexed views revealed no abnormalities.    The scope was then withdrawn from the patient and the procedure completed.  COMPLICATIONS: There were no immediate complications.  ENDOSCOPIC IMPRESSION: 1) Duodenal ulcers - healing. 2) Antral gastritis.  Previously biopsied and found to be H. pylori negative.  RECOMMENDATIONS: 1) PPI indefinitely. 2) Follow up in the office in 1 month. 3) Signing off. _______________________________ Lorrin MaisCarol Ada, MD 04/27/15 3:52 PM   cc:  CPT CODES: ICD CODES:

## 2015-04-17 ENCOUNTER — Encounter (HOSPITAL_COMMUNITY): Payer: Self-pay | Admitting: Gastroenterology

## 2017-08-09 IMAGING — CR DG CHEST 1V PORT
1 series · 1 of 1 positions shown · non-contrast
Comparison: 07/13/2008

CLINICAL DATA: Code ST-elevation MI.

EXAM:
PORTABLE CHEST 1 VIEW

[AP]
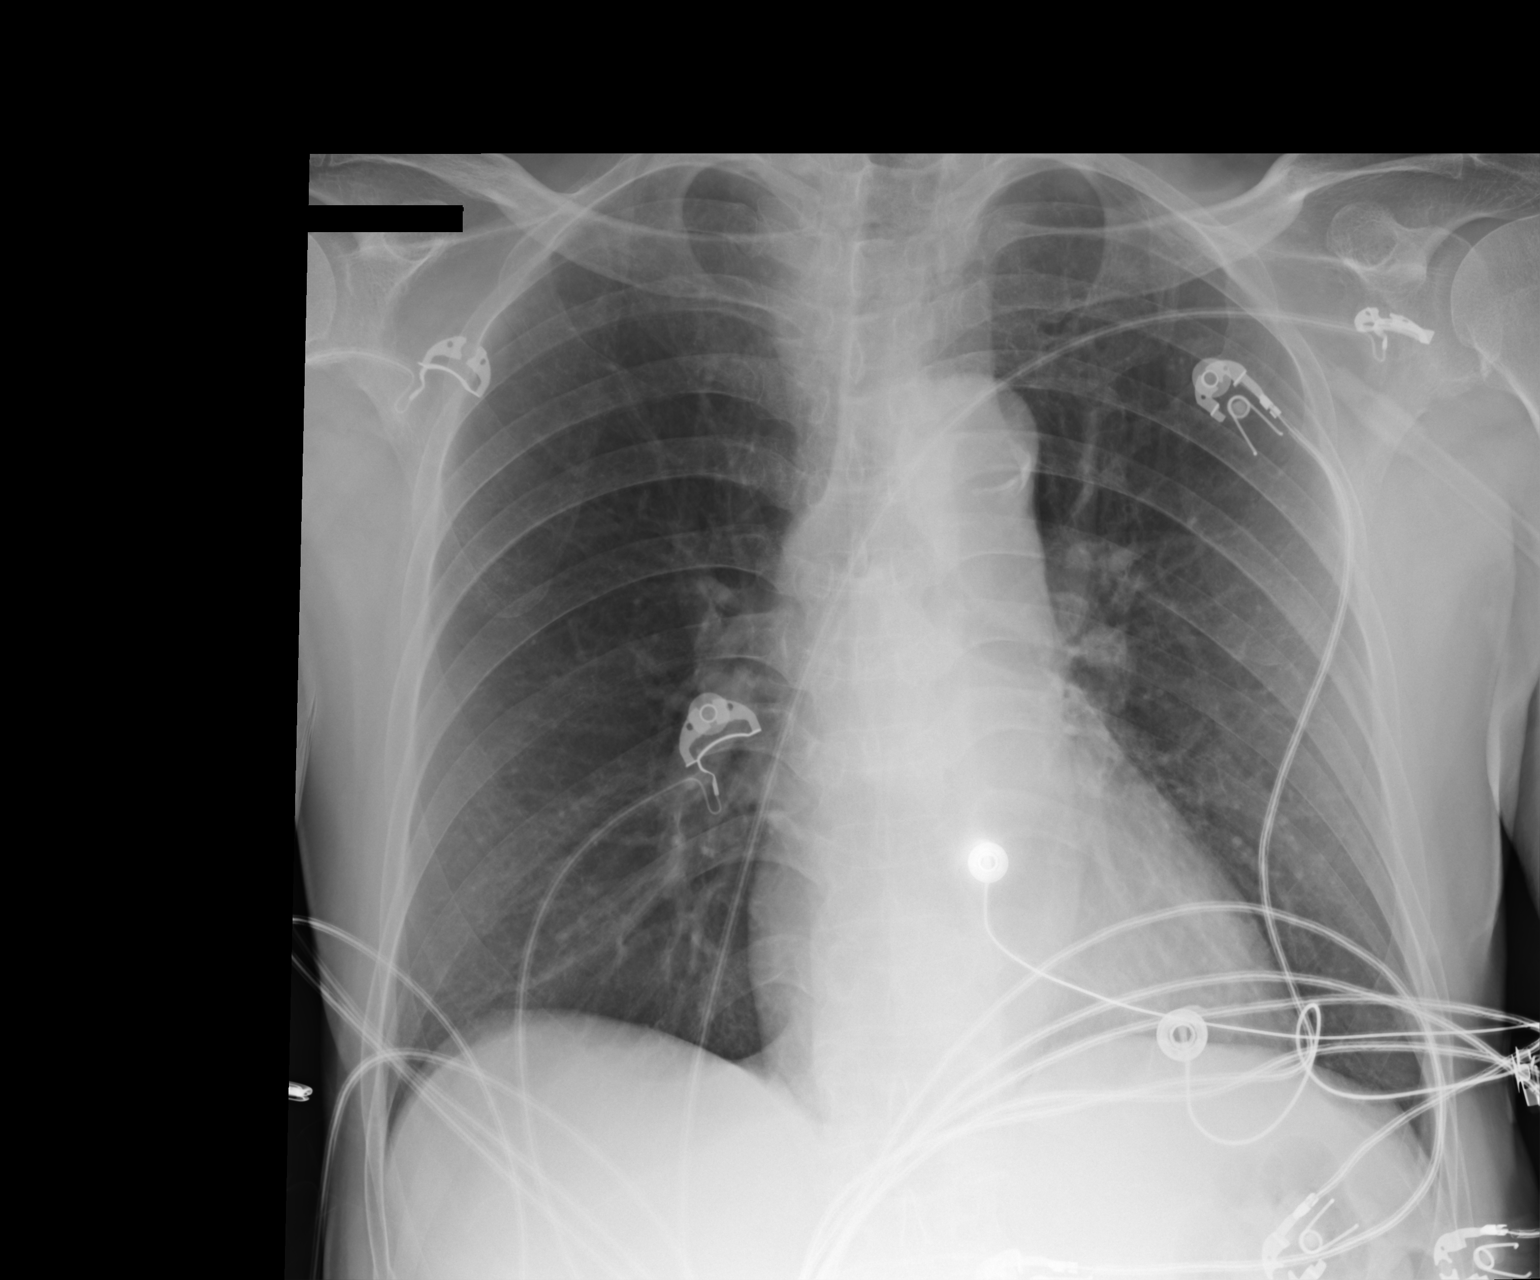

[1 of 1 positions shown; findings below may reference images not displayed]

FINDINGS: The cardiomediastinal contours are normal. Mild atherosclerosis of
the aortic arch. The lungs are clear. Pulmonary vasculature is
normal. No consolidation, pleural effusion, or pneumothorax. No
acute osseous abnormalities are seen.
IMPRESSION: No acute pulmonary process.
